# Patient Record
Sex: Male | Born: 1980 | Race: White | Hispanic: No | Marital: Single | Smoking: Current every day smoker
Health system: Southern US, Community
[De-identification: ages and names within clinical notes are randomized; demographics above are authoritative.]

## PROBLEM LIST (undated history)

## (undated) DIAGNOSIS — K219 Gastro-esophageal reflux disease without esophagitis: Secondary | ICD-10-CM

## (undated) HISTORY — PX: HERNIA REPAIR: SHX51

## (undated) HISTORY — PX: NO PAST SURGERIES: SHX2092

---
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
## 2001-05-21 ENCOUNTER — Emergency Department (HOSPITAL_COMMUNITY): Admission: EM | Admit: 2001-05-21 | Discharge: 2001-05-21 | Payer: Self-pay | Admitting: Emergency Medicine

## 2003-10-24 ENCOUNTER — Emergency Department (HOSPITAL_COMMUNITY): Admission: EM | Admit: 2003-10-24 | Discharge: 2003-10-24 | Payer: Self-pay | Admitting: Emergency Medicine

## 2005-03-18 ENCOUNTER — Emergency Department (HOSPITAL_COMMUNITY): Admission: EM | Admit: 2005-03-18 | Discharge: 2005-03-18 | Payer: Self-pay | Admitting: Emergency Medicine

## 2010-04-25 ENCOUNTER — Emergency Department: Payer: Self-pay | Admitting: Emergency Medicine

## 2019-05-18 ENCOUNTER — Emergency Department: Payer: Self-pay

## 2019-05-18 ENCOUNTER — Other Ambulatory Visit: Payer: Self-pay

## 2019-05-18 ENCOUNTER — Emergency Department
Admission: EM | Admit: 2019-05-18 | Discharge: 2019-05-18 | Disposition: A | Discharge status: Home / Self Care | Payer: Self-pay | Attending: Emergency Medicine | Admitting: Emergency Medicine

## 2019-05-18 DIAGNOSIS — F17200 Nicotine dependence, unspecified, uncomplicated: Secondary | ICD-10-CM | POA: Insufficient documentation

## 2019-05-18 DIAGNOSIS — K439 Ventral hernia without obstruction or gangrene: Secondary | ICD-10-CM | POA: Insufficient documentation

## 2019-05-18 DIAGNOSIS — R1013 Epigastric pain: Secondary | ICD-10-CM | POA: Insufficient documentation

## 2019-05-18 LAB — URINALYSIS, COMPLETE (UACMP) WITH MICROSCOPIC
Bacteria, UA: NONE SEEN
Bilirubin Urine: NEGATIVE
Glucose, UA: NEGATIVE mg/dL
Hgb urine dipstick: NEGATIVE
Ketones, ur: NEGATIVE mg/dL
Leukocytes,Ua: NEGATIVE
Nitrite: NEGATIVE
Protein, ur: NEGATIVE mg/dL
Specific Gravity, Urine: 1.017 (ref 1.005–1.030)
pH: 6 (ref 5.0–8.0)

## 2019-05-18 LAB — LIPASE, BLOOD: Lipase: 25 U/L (ref 11–51)

## 2019-05-18 LAB — COMPREHENSIVE METABOLIC PANEL
ALT: 33 U/L (ref 0–44)
AST: 22 U/L (ref 15–41)
Albumin: 3.9 g/dL (ref 3.5–5.0)
Alkaline Phosphatase: 74 U/L (ref 38–126)
Anion gap: 10 (ref 5–15)
BUN: 9 mg/dL (ref 6–20)
CO2: 24 mmol/L (ref 22–32)
Calcium: 9.3 mg/dL (ref 8.9–10.3)
Chloride: 104 mmol/L (ref 98–111)
Creatinine, Ser: 0.82 mg/dL (ref 0.61–1.24)
GFR calc Af Amer: >60 mL/min (ref >60)
GFR calc non Af Amer: >60 mL/min (ref >60)
Glucose, Bld: 119 mg/dL — ABNORMAL HIGH (ref 70–99)
Potassium: 3.8 mmol/L (ref 3.5–5.1)
Sodium: 138 mmol/L (ref 135–145)
Total Bilirubin: 0.6 mg/dL (ref 0.3–1.2)
Total Protein: 7 g/dL (ref 6.5–8.1)

## 2019-05-18 LAB — CBC
HCT: 43.9 % (ref 39.0–52.0)
Hemoglobin: 14.8 g/dL (ref 13.0–17.0)
MCH: 31.2 pg (ref 26.0–34.0)
MCHC: 33.7 g/dL (ref 30.0–36.0)
MCV: 92.6 fL (ref 80.0–100.0)
Platelets: 250 10*3/uL (ref 150–400)
RBC: 4.74 MIL/uL (ref 4.22–5.81)
RDW: 12.7 % (ref 11.5–15.5)
WBC: 5.7 10*3/uL (ref 4.0–10.5)
nRBC: 0 % (ref 0.0–0.2)

## 2019-05-18 LAB — LACTIC ACID, PLASMA: Lactic Acid, Venous: 1 mmol/L (ref 0.5–1.9)

## 2019-05-18 MED ORDER — FENTANYL CITRATE (PF) 100 MCG/2ML IJ SOLN
100.0000 ug | Freq: Once | INTRAMUSCULAR | Status: AC
  Administered 2019-05-18: 17:00:00 100 ug via INTRAVENOUS
  Filled 2019-05-18: qty 2

## 2019-05-18 MED ORDER — SODIUM CHLORIDE 0.9 % IV BOLUS
1000.0000 mL | Freq: Once | INTRAVENOUS | Status: AC
  Administered 2019-05-18: 1000 mL via INTRAVENOUS

## 2019-05-18 MED ORDER — IOHEXOL 300 MG/ML  SOLN
100.0000 mL | Freq: Once | INTRAMUSCULAR | Status: AC | PRN
  Administered 2019-05-18: 100 mL via INTRAVENOUS
  Filled 2019-05-18: qty 100

## 2019-05-18 NOTE — ED Provider Notes (Signed)
[facility] Emergency Department Provider Note ____________________________________________   None    (approximate)  I have reviewed the triage vital signs and the nursing notes.   HISTORY  Chief Complaint Hernia  HPI Johnny Crawford is a 39 y.o. male who presents to the emergency department for treatment and evaluation of abdominal pain due to hernia that he is typically able to reduce himself. He states that he noticed that it was out last night and has not been able to reduce it. Hernia is secondary to working out with pull up bar 2 years ago. He denies nausea or vomiting. He has been able to pass gas and have a bowel movement today. He denies medical or surgical history outside of abdominal hernia.     History reviewed. No pertinent past medical history.  There are no problems to display for this patient.   History reviewed. No pertinent surgical history.  Prior to Admission medications   Not on File    Allergies Patient has no known allergies.  History reviewed. No pertinent family history.  Social History Social History   Tobacco Use  . Smoking status: Current Every Day Smoker  Substance Use Topics  . Alcohol use: Not on file  . Drug use: Not on file    Review of Systems  Constitutional: No fever/chills Eyes: No visual changes. ENT: No sore throat. Cardiovascular: Denies chest pain. Respiratory: Denies shortness of breath. Gastrointestinal: Positive for abdominal pain. No nausea, no vomiting.  No diarrhea.  No constipation. Genitourinary: Negative for dysuria. Musculoskeletal: Negative for back pain. Skin: Negative for rash. Neurological: Negative for headaches, focal weakness or numbness. ____________________________________________   PHYSICAL EXAM:  VITAL SIGNS: ED Triage Vitals [05/18/19 1456]  Enc Vitals Group     BP 126/66     Pulse Rate 86     Resp 16     Temp 97.8 F (36.6 C)     Temp Source Oral     SpO2 98  %     Weight 185 lb (83.9 kg)     Height 5\' 10"  (1.778 m)     Head Circumference      Peak Flow      Pain Score 8     Pain Loc      Pain Edu?      Excl. in GC?     Constitutional: Alert and oriented. Well appearing and in no acute distress. Eyes: Conjunctivae are normal. PERRL. EOMI. Head: Atraumatic. Nose: No congestion/rhinnorhea. Mouth/Throat: Mucous membranes are moist.  Oropharynx non-erythematous. Neck: No stridor.   Hematological/Lymphatic/Immunilogical: No cervical lymphadenopathy. Cardiovascular: Normal rate, regular rhythm. Grossly normal heart sounds.  Good peripheral circulation. Respiratory: Normal respiratory effort.  No retractions. Lungs CTAB. Gastrointestinal: Soft and tender. Palpable epigastric ventral hernia. No abdominal bruits. Diminished bowel sounds x 4 quadrants. Genitourinary:  Musculoskeletal: No lower extremity tenderness nor edema.  No joint effusions. Neurologic:  Normal speech and language. No gross focal neurologic deficits are appreciated. No gait instability. Skin:  Skin is warm, dry and intact. No rash noted. No discoloration over the palpable epigastric hernia. Psychiatric: Mood and affect are normal. Speech and behavior are normal.  ____________________________________________   LABS (all labs ordered are listed, but only abnormal results are displayed)  Labs Reviewed  COMPREHENSIVE METABOLIC PANEL - Abnormal; Notable for the following components:      Result Value   Glucose, Bld 119 (*)    All other components within normal limits  URINALYSIS, COMPLETE (UACMP) WITH  MICROSCOPIC - Abnormal; Notable for the following components:   Color, Urine YELLOW (*)    APPearance CLEAR (*)    All other components within normal limits  LIPASE, BLOOD  CBC  LACTIC ACID, PLASMA   ____________________________________________  EKG  Not indicated. ____________________________________________  RADIOLOGY  ED MD interpretation:    Small epigastric  ventral hernia containing only fat without associated bowel incarceration or obstruction.  Official radiology report(s): CT ABDOMEN PELVIS W CONTRAST  Result Date: 05/18/2019 CLINICAL DATA:  39 year old male with history of abdominal wall hernia presenting with pain at the site of hernia. EXAM: CT ABDOMEN AND PELVIS WITH CONTRAST TECHNIQUE: Multidetector CT imaging of the abdomen and pelvis was performed using the standard protocol following bolus administration of intravenous contrast. CONTRAST:  OMNIPAQUE IOHEXOL 300 MG/ML  SOLN COMPARISON:  No priors. FINDINGS: Lower chest: Moderate-sized hiatal hernia. Hepatobiliary: Diffuse low attenuation throughout the hepatic parenchyma, indicative of hepatic steatosis. No suspicious cystic or solid hepatic lesions. No intra or extrahepatic biliary ductal dilatation. Gallbladder is normal in appearance. Pancreas: No pancreatic mass. No pancreatic ductal dilatation. No pancreatic or peripancreatic fluid collections or inflammatory changes. Spleen: Unremarkable. Adrenals/Urinary Tract: Bilateral kidneys and bilateral adrenal glands are normal in appearance. No hydroureteronephrosis. Urinary bladder is normal in appearance. Stomach/Bowel: Normal appearance of the stomach. No pathologic dilatation of small bowel or colon. Normal appendix. Vascular/Lymphatic: No significant atherosclerotic disease, aneurysm or dissection noted in the abdominal or pelvic vasculature. No lymphadenopathy noted in the abdomen or pelvis. Reproductive: Prostate gland and seminal vesicles are unremarkable in appearance. Other: Small epigastric ventral hernia containing only omental fat. No significant volume of ascites. No pneumoperitoneum. Musculoskeletal: There are no aggressive appearing lytic or blastic lesions noted in the visualized portions of the skeleton. IMPRESSION: 1. Small epigastric ventral hernia contains only omental fat. No associated bowel incarceration or obstruction at this  time. 2. Hepatic steatosis. 3. Moderate-sized hiatal hernia. Electronically Signed   By: Trudie Reed M.D.   On: 05/18/2019 17:56    ____________________________________________   PROCEDURES  Procedure(s) performed (including Critical Care):  Procedures  ____________________________________________   INITIAL IMPRESSION / ASSESSMENT AND PLAN     39 year old male presenting to the emergency department for treatment and evaluation of abdominal hernia that he is typically able to reduce on his own.  He states that he has had pain in has been unable to get the hernia to reduce since last night.  DIFFERENTIAL DIAGNOSIS  Nonreducible ventral hernia, incarcerated hernia  ED COURSE  Initial attempt to reduce the hernia was unsuccessful.  Patient is excessively tender is unable to relax his abdomen.  Plan will be to give him IV fentanyl for pain and relaxation and then attempt again.   After fentanyl given patient was laid flat and hernia reduction attempted.  This time it appears that it was successful.  The patient states that he feels better, and does not have that deep burning pain but he is not sure that it is completely back to normal.  CT ordered.  CT scan is reassuring.  Does show that he has a epigastric ventral hernia that currently only contains fat.  Plan will be to discharge him home.  He was advised to call and schedule an appointment with surgery when possible.  He was given ER return precautions. ____________________________________________   FINAL CLINICAL IMPRESSION(S) / ED DIAGNOSES  Final diagnoses:  Ventral hernia without obstruction or gangrene     ED Discharge Orders    None  KELYN KOSKELA was evaluated in Emergency Department on 05/18/2019 for the symptoms described in the history of present illness. He was evaluated in the context of the global COVID-19 pandemic, which necessitated consideration that the patient might be at risk for infection  with the SARS-CoV-2 virus that causes COVID-19. Institutional protocols and algorithms that pertain to the evaluation of patients at risk for COVID-19 are in a state of rapid change based on information released by regulatory bodies including the CDC and federal and state organizations. These policies and algorithms were followed during the patient's care in the ED.   Note:  This document was prepared using Dragon voice recognition software and may include unintentional dictation errors.   Victorino Dike, FNP 05/18/19 1955    Harvest Dark, MD 05/18/19 2314

## 2019-05-18 NOTE — ED Triage Notes (Signed)
Pt states about 6 months ago he was dx with hernia and states last night began to hurt a lot. Hernia noted to upper abd. A&O, ambulatory. denie N&V&D.

## 2019-05-18 NOTE — Discharge Instructions (Addendum)
I recommend that you call and schedule an appointment with surgery to discuss hernia repair.  If your hernia comes out again and won't go back in as it did last night, return to the ER.  Return to the ER if there is any discoloration or unusual pain over the site of the hernia.  Avoid heavy lifting and using your stomach muscles to sit up.

## 2019-05-18 NOTE — ED Notes (Signed)
Pt with steady gait in room, alert and oriented. Pt refused wheel chair to lobby multiple times. Pt states family is picking him up and family will drive his car home. Pt denies dizziness or sleepiness. Pt walking with steady gait.

## 2019-05-22 ENCOUNTER — Ambulatory Visit: Payer: Self-pay | Admitting: General Surgery

## 2019-05-29 ENCOUNTER — Ambulatory Visit (INDEPENDENT_AMBULATORY_CARE_PROVIDER_SITE_OTHER): Payer: Self-pay | Admitting: General Surgery

## 2019-05-29 ENCOUNTER — Encounter: Payer: Self-pay | Admitting: General Surgery

## 2019-05-29 ENCOUNTER — Other Ambulatory Visit: Payer: Self-pay

## 2019-05-29 VITALS — BP 119/73 | HR 83 | Temp 95.5°F | Ht 70.0 in | Wt 187.0 lb

## 2019-05-29 DIAGNOSIS — K436 Other and unspecified ventral hernia with obstruction, without gangrene: Secondary | ICD-10-CM | POA: Insufficient documentation

## 2019-05-29 DIAGNOSIS — K439 Ventral hernia without obstruction or gangrene: Secondary | ICD-10-CM

## 2019-05-29 NOTE — H&P (View-Only) (Signed)
Patient ID: Johnny Crawford, male   DOB: 1980-08-23, 39 y.o.   MRN: [id number]  Chief Complaint  Patient presents with  . New Patient (Initial Visit)     New Patient follow up ED @ [facility] for ventral hernia w/Dr. Lady Gary (03/21)    HPI Johnny Crawford is a 39 y.o. male.  He presented to the emergency department on 18 May 2019.  He had epigastric abdominal pain.  A CT scan was performed that demonstrated a ventral hernia in the epigastric region.  He states that it is uncomfortable, but not specifically painful.  He says that it interferes with his ability to exercise.  He only notices bulging with Valsalva-type maneuvers.  He denies any nausea or vomiting.  No fevers or chills.  No diarrhea or constipation.  Reports that the hernia was first identified a few years ago while he was incarcerated, but as he was getting ready to be released, they did not repair it; he was told to seek medical attention if he experienced any issues.  This is what ultimately caused him to seek emergency room attention on March 21.  He is interested in surgical repair.   History reviewed. No pertinent past medical history.  History reviewed. No pertinent surgical history.  Family History  Problem Relation Age of Onset  . COPD Mother   . Stroke Sister     Social History Social History   Tobacco Use  . Smoking status: Current Every Day Smoker  . Smokeless tobacco: Never Used  Substance Use Topics  . Alcohol use: Never  . Drug use: Never    No Known Allergies  No current outpatient medications on file.   No current facility-administered medications for this visit.    Review of Systems Review of Systems  All other systems reviewed and are negative. Or as discussed in the history of present illness.  Blood pressure 119/73, pulse 83, temperature (!) 95.5 F (35.3 C), temperature source Temporal, height 5\' 10"  (1.778 m), weight 187 lb (84.8 kg), SpO2 97 %. Body mass index is 26.83 kg/m.  Physical  Exam Physical Exam Vitals reviewed.  Constitutional:      General: He is not in acute distress.    Appearance: Normal appearance. He is normal weight.  HENT:     Head: Normocephalic and atraumatic.     Nose:     Comments: Covered with a mask secondary to COVID-19 precautions    Mouth/Throat:     Comments: Covered with a mask secondary to COVID-19 precautions Eyes:     General:        Right eye: No discharge.        Left eye: No discharge.     Conjunctiva/sclera: Conjunctivae normal.  Neck:     Comments: No palpable thyroid masses or thyromegaly appreciated.  The gland moves freely with deglutition. Cardiovascular:     Rate and Rhythm: Normal rate and regular rhythm.     Pulses: Normal pulses.  Pulmonary:     Effort: Pulmonary effort is normal.     Breath sounds: Normal breath sounds.  Abdominal:     General: Abdomen is flat. Bowel sounds are normal.     Palpations: Abdomen is soft.     Hernia: A hernia is present.     Comments: There is an epigastric hernia present just below the xiphoid process, extending to approximately 8 cm above the umbilicus.  Genitourinary:    Comments: Deferred Musculoskeletal:  General: No swelling or tenderness. Normal range of motion.     Cervical back: Normal range of motion. No rigidity.  Lymphadenopathy:     Cervical: No cervical adenopathy.  Skin:    General: Skin is warm.     Comments: Extensive tattoos  Neurological:     General: No focal deficit present.     Mental Status: He is alert and oriented to person, place, and time.  Psychiatric:        Mood and Affect: Mood normal.        Behavior: Behavior normal.        Thought Content: Thought content normal.     Data Reviewed I reviewed the emergency department report from May 18, 2019.  The imaging was reviewed and does demonstrate an epigastric hernia.  There also appears to be a tiny umbilical hernia, but I am unable to appreciate this on physical examination.  The  radiologist report is copied here: CLINICAL DATA:  39 year old male with history of abdominal wall hernia presenting with pain at the site of hernia.  EXAM: CT ABDOMEN AND PELVIS WITH CONTRAST  TECHNIQUE: Multidetector CT imaging of the abdomen and pelvis was performed using the standard protocol following bolus administration of intravenous contrast.  CONTRAST:  139mL OMNIPAQUE IOHEXOL 300 MG/ML  SOLN  COMPARISON:  No priors.  FINDINGS: Lower chest: Moderate-sized hiatal hernia.  Hepatobiliary: Diffuse low attenuation throughout the hepatic parenchyma, indicative of hepatic steatosis. No suspicious cystic or solid hepatic lesions. No intra or extrahepatic biliary ductal dilatation. Gallbladder is normal in appearance.  Pancreas: No pancreatic mass. No pancreatic ductal dilatation. No pancreatic or peripancreatic fluid collections or inflammatory changes.  Spleen: Unremarkable.  Adrenals/Urinary Tract: Bilateral kidneys and bilateral adrenal glands are normal in appearance. No hydroureteronephrosis. Urinary bladder is normal in appearance.  Stomach/Bowel: Normal appearance of the stomach. No pathologic dilatation of small bowel or colon. Normal appendix.  Vascular/Lymphatic: No significant atherosclerotic disease, aneurysm or dissection noted in the abdominal or pelvic vasculature. No lymphadenopathy noted in the abdomen or pelvis.  Reproductive: Prostate gland and seminal vesicles are unremarkable in appearance.  Other: Small epigastric ventral hernia containing only omental fat. No significant volume of ascites. No pneumoperitoneum.  Musculoskeletal: There are no aggressive appearing lytic or blastic lesions noted in the visualized portions of the skeleton.  IMPRESSION: 1. Small epigastric ventral hernia contains only omental fat. No associated bowel incarceration or obstruction at this time. 2. Hepatic steatosis. 3. Moderate-sized hiatal  hernia.    Assessment This is a 39 year old man with an epigastric hernia.  It is symptomatic and prevents him from engaging in his desired daily activities.  Plan I have offered him an open repair of his epigastric hernia.  I discussed the risks of the operation with him.  These include, but are not limited to, bleeding, infection, mesh complications, injury to bowel or other surrounding structures, and hernia recurrence.  He is a smoker and I have cautioned him that this puts him at higher risk for recurrence.  I recommended that he quit smoking prior to his OR date.  He is also aware that he will have to refrain from lifting anything heavier than 10 pounds for 6 weeks after his operation.  He has agreed to accept these parameters and we will work on getting him scheduled.    Johnny Crawford 05/29/2019, 10:41 AM

## 2019-05-29 NOTE — Progress Notes (Signed)
Patient ID: Johnny Crawford, male   DOB: 1980-08-23, 39 y.o.   MRN: [id number]  Chief Complaint  Patient presents with  . New Patient (Initial Visit)     New Patient follow up ED @ [facility] for ventral hernia w/Dr. Lady Gary (03/21)    HPI Johnny Crawford is a 39 y.o. male.  He presented to the emergency department on 18 May 2019.  He had epigastric abdominal pain.  A CT scan was performed that demonstrated a ventral hernia in the epigastric region.  He states that it is uncomfortable, but not specifically painful.  He says that it interferes with his ability to exercise.  He only notices bulging with Valsalva-type maneuvers.  He denies any nausea or vomiting.  No fevers or chills.  No diarrhea or constipation.  Reports that the hernia was first identified a few years ago while he was incarcerated, but as he was getting ready to be released, they did not repair it; he was told to seek medical attention if he experienced any issues.  This is what ultimately caused him to seek emergency room attention on March 21.  He is interested in surgical repair.   History reviewed. No pertinent past medical history.  History reviewed. No pertinent surgical history.  Family History  Problem Relation Age of Onset  . COPD Mother   . Stroke Sister     Social History Social History   Tobacco Use  . Smoking status: Current Every Day Smoker  . Smokeless tobacco: Never Used  Substance Use Topics  . Alcohol use: Never  . Drug use: Never    No Known Allergies  No current outpatient medications on file.   No current facility-administered medications for this visit.    Review of Systems Review of Systems  All other systems reviewed and are negative. Or as discussed in the history of present illness.  Blood pressure 119/73, pulse 83, temperature (!) 95.5 F (35.3 C), temperature source Temporal, height 5\' 10"  (1.778 m), weight 187 lb (84.8 kg), SpO2 97 %. Body mass index is 26.83 kg/m.  Physical  Exam Physical Exam Vitals reviewed.  Constitutional:      General: He is not in acute distress.    Appearance: Normal appearance. He is normal weight.  HENT:     Head: Normocephalic and atraumatic.     Nose:     Comments: Covered with a mask secondary to COVID-19 precautions    Mouth/Throat:     Comments: Covered with a mask secondary to COVID-19 precautions Eyes:     General:        Right eye: No discharge.        Left eye: No discharge.     Conjunctiva/sclera: Conjunctivae normal.  Neck:     Comments: No palpable thyroid masses or thyromegaly appreciated.  The gland moves freely with deglutition. Cardiovascular:     Rate and Rhythm: Normal rate and regular rhythm.     Pulses: Normal pulses.  Pulmonary:     Effort: Pulmonary effort is normal.     Breath sounds: Normal breath sounds.  Abdominal:     General: Abdomen is flat. Bowel sounds are normal.     Palpations: Abdomen is soft.     Hernia: A hernia is present.     Comments: There is an epigastric hernia present just below the xiphoid process, extending to approximately 8 cm above the umbilicus.  Genitourinary:    Comments: Deferred Musculoskeletal:  General: No swelling or tenderness. Normal range of motion.     Cervical back: Normal range of motion. No rigidity.  Lymphadenopathy:     Cervical: No cervical adenopathy.  Skin:    General: Skin is warm.     Comments: Extensive tattoos  Neurological:     General: No focal deficit present.     Mental Status: He is alert and oriented to person, place, and time.  Psychiatric:        Mood and Affect: Mood normal.        Behavior: Behavior normal.        Thought Content: Thought content normal.     Data Reviewed I reviewed the emergency department report from May 18, 2019.  The imaging was reviewed and does demonstrate an epigastric hernia.  There also appears to be a tiny umbilical hernia, but I am unable to appreciate this on physical examination.  The  radiologist report is copied here: CLINICAL DATA:  38-year-old male with history of abdominal wall hernia presenting with pain at the site of hernia.  EXAM: CT ABDOMEN AND PELVIS WITH CONTRAST  TECHNIQUE: Multidetector CT imaging of the abdomen and pelvis was performed using the standard protocol following bolus administration of intravenous contrast.  CONTRAST:  100mL OMNIPAQUE IOHEXOL 300 MG/ML  SOLN  COMPARISON:  No priors.  FINDINGS: Lower chest: Moderate-sized hiatal hernia.  Hepatobiliary: Diffuse low attenuation throughout the hepatic parenchyma, indicative of hepatic steatosis. No suspicious cystic or solid hepatic lesions. No intra or extrahepatic biliary ductal dilatation. Gallbladder is normal in appearance.  Pancreas: No pancreatic mass. No pancreatic ductal dilatation. No pancreatic or peripancreatic fluid collections or inflammatory changes.  Spleen: Unremarkable.  Adrenals/Urinary Tract: Bilateral kidneys and bilateral adrenal glands are normal in appearance. No hydroureteronephrosis. Urinary bladder is normal in appearance.  Stomach/Bowel: Normal appearance of the stomach. No pathologic dilatation of small bowel or colon. Normal appendix.  Vascular/Lymphatic: No significant atherosclerotic disease, aneurysm or dissection noted in the abdominal or pelvic vasculature. No lymphadenopathy noted in the abdomen or pelvis.  Reproductive: Prostate gland and seminal vesicles are unremarkable in appearance.  Other: Small epigastric ventral hernia containing only omental fat. No significant volume of ascites. No pneumoperitoneum.  Musculoskeletal: There are no aggressive appearing lytic or blastic lesions noted in the visualized portions of the skeleton.  IMPRESSION: 1. Small epigastric ventral hernia contains only omental fat. No associated bowel incarceration or obstruction at this time. 2. Hepatic steatosis. 3. Moderate-sized hiatal  hernia.    Assessment This is a 38-year-old man with an epigastric hernia.  It is symptomatic and prevents him from engaging in his desired daily activities.  Plan I have offered him an open repair of his epigastric hernia.  I discussed the risks of the operation with him.  These include, but are not limited to, bleeding, infection, mesh complications, injury to bowel or other surrounding structures, and hernia recurrence.  He is a smoker and I have cautioned him that this puts him at higher risk for recurrence.  I recommended that he quit smoking prior to his OR date.  He is also aware that he will have to refrain from lifting anything heavier than 10 pounds for 6 weeks after his operation.  He has agreed to accept these parameters and we will work on getting him scheduled.    Jolly Bleicher 05/29/2019, 10:41 AM   

## 2019-05-29 NOTE — Patient Instructions (Addendum)
Our surgery scheduler Kennyth Arnold will contact you within the next 24-48 hrs for surgery. During that call, Kennyth Arnold will discuss the preparation for surgery. Kennyth Arnold will also discuss the different dates and times for surgery. Please have the BLUE sheet available when she contacts you. If you have any questions or concerns, please do not hesitate to contact our office.    Ventral Hernia  A ventral hernia is a bulge of tissue from inside the abdomen that pushes through a weak area of the muscles that form the front wall of the abdomen. The tissues inside the abdomen are inside a sac (peritoneum). These tissues include the small intestine, large intestine, and the fatty tissue that covers the intestines (omentum). Sometimes, the bulge that forms a hernia contains intestines. Other hernias contain only fat. Ventral hernias do not go away without surgical treatment. There are several types of ventral hernias. You may have:  A hernia at an incision site from previous abdominal surgery (incisional hernia).  A hernia just above the belly button (epigastric hernia), or at the belly button (umbilical hernia). These types of hernias can develop from heavy lifting or straining.  A hernia that comes and goes (reducible hernia). It may be visible only when you lift or strain. This type of hernia can be pushed back into the abdomen (reduced).  A hernia that traps abdominal tissue inside the hernia (incarcerated hernia). This type of hernia does not reduce.  A hernia that cuts off blood flow to the tissues inside the hernia (strangulated hernia). The tissues can start to die if this happens. This is a very painful bulge that cannot be reduced. A strangulated hernia is a medical emergency. What are the causes? This condition is caused by abdominal tissue putting pressure on an area of weakness in the abdominal muscles. What increases the risk? The following factors may make you more likely to develop this  condition:  Being male.  Being 63 or older.  Being overweight or obese.  Having had previous abdominal surgery, especially if there was an infection after surgery.  Having had an injury to the abdominal wall.  Having had several pregnancies.  Having a buildup of fluid inside the abdomen (ascites). What are the signs or symptoms? The only symptom of a ventral hernia may be a painless bulge in the abdomen. A reducible hernia may be visible only when you strain, cough, or lift. Other symptoms may include:  Dull pain.  A feeling of pressure. Signs and symptoms of a strangulated hernia may include:  Increasing pain.  Nausea and vomiting.  Pain when pressing on the hernia.  The skin over the hernia turning red or purple.  Constipation.  Blood in the stool (feces). How is this diagnosed? This condition may be diagnosed based on:  Your symptoms.  Your medical history.  A physical exam. You may be asked to cough or strain while standing. These actions increase the pressure inside your abdomen and force the hernia through the opening in your muscles. Your health care provider may try to reduce the hernia by pressing on it.  Imaging studies, such as an ultrasound or CT scan. How is this treated? This condition is treated with surgery. If you have a strangulated hernia, surgery is done as soon as possible. If your hernia is small and not incarcerated, you may be asked to lose some weight before surgery. Follow these instructions at home:  Follow instructions from your health care provider about eating or drinking restrictions.  If  you are overweight, your health care provider may recommend that you increase your activity level and eat a healthier diet.  Do not lift anything that is heavier than 10 lb (4.5 kg).  Return to your normal activities as told by your health care provider. Ask your health care provider what activities are safe for you. You may need to avoid  activities that increase pressure on your hernia.  Take over-the-counter and prescription medicines only as told by your health care provider.  Keep all follow-up visits as told by your health care provider. This is important. Contact a health care provider if:  Your hernia gets larger.  Your hernia becomes painful. Get help right away if:  Your hernia becomes increasingly painful.  You have pain along with any of the following: ? Changes in skin color in the area of the hernia. ? Nausea. ? Vomiting. ? Fever. Summary  A ventral hernia is a bulge of tissue from inside the abdomen that pushes through a weak area of the muscles that form the front wall of the abdomen.  This condition is treated with surgery, which may be urgent depending on your hernia.  Do not lift anything that is heavier than 10 lb (4.5 kg), and follow activity instructions from your health care provider. This information is not intended to replace advice given to you by your health care provider. Make sure you discuss any questions you have with your health care provider. Document Revised: 03/28/2017 Document Reviewed: 09/04/2016 Elsevier Patient Education  [place].

## 2019-06-02 ENCOUNTER — Telehealth: Payer: Self-pay | Admitting: General Surgery

## 2019-06-02 NOTE — Telephone Encounter (Signed)
Pt has been advised of Pre-Admission date/time, COVID Testing date and Surgery date.  Surgery Date: 06/16/19 Preadmission Testing Date: 06/06/19 (phone 8a-1p) Covid Testing Date: 06/12/19 - patient advised to [facility] to [facility] ([address] [facility]) between 8a-1p   Patient has been made aware to call [phone], between 1-3:00pm the day before surgery, to find out what time to arrive for surgery.

## 2019-06-06 ENCOUNTER — Other Ambulatory Visit: Payer: Self-pay | Admitting: General Surgery

## 2019-06-06 ENCOUNTER — Other Ambulatory Visit: Payer: Self-pay

## 2019-06-06 ENCOUNTER — Encounter
Admission: RE | Admit: 2019-06-06 | Discharge: 2019-06-06 | Disposition: A | Discharge status: Home / Self Care | Payer: Self-pay | Source: Ambulatory Visit | Attending: General Surgery | Admitting: General Surgery

## 2019-06-06 DIAGNOSIS — Z01818 Encounter for other preprocedural examination: Secondary | ICD-10-CM | POA: Insufficient documentation

## 2019-06-06 DIAGNOSIS — K439 Ventral hernia without obstruction or gangrene: Secondary | ICD-10-CM

## 2019-06-06 HISTORY — DX: Gastro-esophageal reflux disease without esophagitis: K21.9

## 2019-06-06 NOTE — Patient Instructions (Addendum)
INSTRUCTIONS FOR SURGERY     Your surgery is scheduled for:   Monday, April 19TH     To find out your arrival time for the day of surgery,          please call [phone] between 1 pm and 3 pm on :  Friday, April 16TH     When you arrive for surgery, report to the SECOND FLOOR OF THE MEDICAL MALL.       Do NOT stop on the first floor to register.    REMEMBER: Instructions that are not followed completely may result in serious medical risk,  up to and including death, or upon the discretion of your surgeon and anesthesiologist,            your surgery may need to be rescheduled.  __X__ 1. Do not eat food after midnight the night before your procedure.                    No gum, candy, lozenger, tic tacs, tums or hard candies.                  ABSOLUTELY NOTHING SOLID IN YOUR MOUTH AFTER MIDNIGHT                    You may drink unlimited clear liquids up to 2 hours before you are scheduled to arrive for surgery.                   Do not drink anything within those 2 hours unless you need to take medicine, then take the                   smallest amount you need.  Clear liquids include:  water, apple juice without pulp,                   any flavor Gatorade, Black coffee, black tea.  Sugar may be added but no dairy/ honey /lemon.                        Broth and jello is not considered a clear liquid.  __x__  2. On the morning of surgery, please brush your teeth with toothpaste and water. You may rinse with                  mouthwash if you wish but DO NOT SWALLOW TOOTHPASTE OR MOUTHWASH  __X___3. NO alcohol for 24 hours before or after surgery.  __x___ 4.  Do NOT smoke or use e-cigarettes for 24 HOURS PRIOR TO SURGERY.                      DO NOT Use any chewable tobacco products for at least 6 hours prior to surgery.  ___x__ 6. Notify your doctor if there is any change in your medical condition, such as fever,    infection, vomitting, diarrhea or any open sores.  __x___ 7.  USE the CHG SOAP as instructed, the night before surgery and the day of surgery.  Once you have washed with this soap, do NOT use any of the following: Powders, perfumes                    or lotions. Please do not wear make up, hairpins, clips or nail polish. You may wear deodorant.                   Men may shave their face and neck.  Women need to shave 48 hours prior to surgery.                   DO NOT wear ANY jewelry on the day of surgery. If there are rings that are too tight to                    remove easily, please address this prior to the surgery day. Piercings need to be removed.                                                                     NO METAL ON YOUR BODY.                    Do NOT bring any valuables.  If you came to Pre-Admit testing then you will not need license,                     insurance card or credit card.  If you will be staying overnight, please either leave your things in                     the car or have your family be responsible for these items.                     [facility] IS NOT RESPONSIBLE FOR BELONGINGS OR VALUABLES.  ___X__ 8. DO NOT wear contact lenses on surgery day.  You may not have dentures,                     Hearing aides, contacts or glasses in the operating room. These items can be                    Placed in the Recovery Room to receive immediately after surgery.  __x___ 9. IF YOU ARE SCHEDULED TO GO HOME ON THE SAME DAY, YOU MUST                   Have someone to drive you home and to stay with you  for the first 24 hours.                    Have an arrangement prior to arriving on surgery day.  ___x__ 10. Take the following medications on the morning of surgery with a sip of water:                              1.  nothing                     2.  ____ 11.  Follow any instructions provided to you by your surgeon.                         Such as enema, clear liquid bowel prep  __X__  12. STOP all Aspirin products one week prior to surgery.                       THIS INCLUDES BC POWDERS / GOODIES POWDER  __x___ 13. STOP Anti-inflammatories one week prior to surgery                      This includes IBUPROFEN / MOTRIN / ADVIL / ALEVE/ NAPROXYN                    YOU MAY TAKE TYLENOL ANY TIME PRIOR TO SURGERY.  ___x___18.  Wear clean and comfortable clothing to the hospital.                      Have something loose around your middle.  BRING PHONE NUMBERS FOR YOUR CONTACT PEOPLE.  BEGIN STOOL SOFTENERS A DAY OR TWO PRIOR TO SURGERY.  HAVE A PILLOW TO SUPPORT YOUR ABDOMEN AFTER SURGERY.

## 2019-06-12 ENCOUNTER — Other Ambulatory Visit
Admission: RE | Admit: 2019-06-12 | Discharge: 2019-06-12 | Disposition: A | Discharge status: Home / Self Care | Payer: HRSA Program | Source: Ambulatory Visit | Attending: General Surgery | Admitting: General Surgery

## 2019-06-12 ENCOUNTER — Other Ambulatory Visit: Payer: Self-pay

## 2019-06-12 DIAGNOSIS — Z20822 Contact with and (suspected) exposure to covid-19: Secondary | ICD-10-CM | POA: Insufficient documentation

## 2019-06-12 DIAGNOSIS — Z01812 Encounter for preprocedural laboratory examination: Secondary | ICD-10-CM | POA: Diagnosis present

## 2019-06-12 LAB — SARS CORONAVIRUS 2 (TAT 6-24 HRS): SARS Coronavirus 2: NEGATIVE

## 2019-06-16 ENCOUNTER — Ambulatory Visit: Payer: Self-pay | Admitting: Registered Nurse

## 2019-06-16 ENCOUNTER — Other Ambulatory Visit: Payer: Self-pay

## 2019-06-16 ENCOUNTER — Ambulatory Visit
Admission: AD | Admit: 2019-06-16 | Discharge: 2019-06-16 | Disposition: A | Discharge status: Home / Self Care | Payer: Self-pay | Attending: General Surgery | Admitting: General Surgery

## 2019-06-16 ENCOUNTER — Encounter
Admission: AD | Disposition: A | Discharge status: Home / Self Care | Payer: Self-pay | Source: Home / Self Care | Attending: General Surgery

## 2019-06-16 ENCOUNTER — Encounter: Payer: Self-pay | Admitting: General Surgery

## 2019-06-16 DIAGNOSIS — K436 Other and unspecified ventral hernia with obstruction, without gangrene: Secondary | ICD-10-CM | POA: Insufficient documentation

## 2019-06-16 DIAGNOSIS — K760 Fatty (change of) liver, not elsewhere classified: Secondary | ICD-10-CM | POA: Insufficient documentation

## 2019-06-16 DIAGNOSIS — F17200 Nicotine dependence, unspecified, uncomplicated: Secondary | ICD-10-CM | POA: Insufficient documentation

## 2019-06-16 DIAGNOSIS — K439 Ventral hernia without obstruction or gangrene: Secondary | ICD-10-CM

## 2019-06-16 HISTORY — PX: EPIGASTRIC HERNIA REPAIR: SHX404

## 2019-06-16 SURGERY — REPAIR, HERNIA, EPIGASTRIC, ADULT
Anesthesia: General

## 2019-06-16 MED ORDER — CELECOXIB 200 MG PO CAPS
ORAL_CAPSULE | ORAL | Status: AC
  Administered 2019-06-16: 200 mg via ORAL
  Filled 2019-06-16: qty 1

## 2019-06-16 MED ORDER — MIDAZOLAM HCL 2 MG/2ML IJ SOLN
INTRAMUSCULAR | Status: AC
  Filled 2019-06-16: qty 2

## 2019-06-16 MED ORDER — ONDANSETRON HCL 4 MG/2ML IJ SOLN
INTRAMUSCULAR | Status: AC
  Filled 2019-06-16: qty 2

## 2019-06-16 MED ORDER — CHLORHEXIDINE GLUCONATE CLOTH 2 % EX PADS
6.0000 | MEDICATED_PAD | Freq: Once | CUTANEOUS | Status: AC
  Administered 2019-06-16: 6 via TOPICAL

## 2019-06-16 MED ORDER — ACETAMINOPHEN 500 MG PO TABS
ORAL_TABLET | ORAL | Status: AC
  Administered 2019-06-16: 1000 mg via ORAL
  Filled 2019-06-16: qty 2

## 2019-06-16 MED ORDER — ONDANSETRON HCL 4 MG/2ML IJ SOLN
INTRAMUSCULAR | Status: DC | PRN
  Administered 2019-06-16: 4 mg via INTRAVENOUS

## 2019-06-16 MED ORDER — LACTATED RINGERS IV SOLN: INTRAVENOUS | Status: DC

## 2019-06-16 MED ORDER — GABAPENTIN 300 MG PO CAPS
ORAL_CAPSULE | ORAL | Status: AC
  Administered 2019-06-16: 300 mg via ORAL
  Filled 2019-06-16: qty 1

## 2019-06-16 MED ORDER — GLYCOPYRROLATE 0.2 MG/ML IJ SOLN
INTRAMUSCULAR | Status: AC
  Filled 2019-06-16: qty 1

## 2019-06-16 MED ORDER — BUPIVACAINE HCL (PF) 0.25 % IJ SOLN
INTRAMUSCULAR | Status: AC
  Filled 2019-06-16: qty 30

## 2019-06-16 MED ORDER — BUPIVACAINE LIPOSOME 1.3 % IJ SUSP
INTRAMUSCULAR | Status: DC | PRN
  Administered 2019-06-16: 20 mL

## 2019-06-16 MED ORDER — GLYCOPYRROLATE 0.2 MG/ML IJ SOLN
INTRAMUSCULAR | Status: DC | PRN
  Administered 2019-06-16: .2 mg via INTRAVENOUS

## 2019-06-16 MED ORDER — ROCURONIUM BROMIDE 100 MG/10ML IV SOLN
INTRAVENOUS | Status: DC | PRN
  Administered 2019-06-16: 50 mg via INTRAVENOUS

## 2019-06-16 MED ORDER — DEXAMETHASONE SODIUM PHOSPHATE 10 MG/ML IJ SOLN
INTRAMUSCULAR | Status: AC
  Filled 2019-06-16: qty 1

## 2019-06-16 MED ORDER — FENTANYL CITRATE (PF) 100 MCG/2ML IJ SOLN
INTRAMUSCULAR | Status: AC
  Filled 2019-06-16: qty 2

## 2019-06-16 MED ORDER — LIDOCAINE-EPINEPHRINE 1 %-1:100000 IJ SOLN
INTRAMUSCULAR | Status: DC | PRN
  Administered 2019-06-16: 10 mL via INTRAMUSCULAR

## 2019-06-16 MED ORDER — PHENYLEPHRINE HCL (PRESSORS) 10 MG/ML IV SOLN
INTRAVENOUS | Status: DC | PRN
  Administered 2019-06-16: 100 ug via INTRAVENOUS

## 2019-06-16 MED ORDER — ACETAMINOPHEN 500 MG PO TABS: 1000.0000 mg | ORAL_TABLET | ORAL | Status: AC

## 2019-06-16 MED ORDER — ONDANSETRON HCL 4 MG/2ML IJ SOLN: 4.0000 mg | Freq: Once | INTRAMUSCULAR | Status: DC | PRN

## 2019-06-16 MED ORDER — FENTANYL CITRATE (PF) 100 MCG/2ML IJ SOLN
INTRAMUSCULAR | Status: DC | PRN
  Administered 2019-06-16: 50 ug via INTRAVENOUS
  Administered 2019-06-16: 100 ug via INTRAVENOUS

## 2019-06-16 MED ORDER — LIDOCAINE-EPINEPHRINE 1 %-1:100000 IJ SOLN
INTRAMUSCULAR | Status: AC
  Filled 2019-06-16: qty 1

## 2019-06-16 MED ORDER — BUPIVACAINE LIPOSOME 1.3 % IJ SUSP
INTRAMUSCULAR | Status: AC
  Filled 2019-06-16: qty 20

## 2019-06-16 MED ORDER — LIDOCAINE HCL (PF) 2 % IJ SOLN
INTRAMUSCULAR | Status: AC
  Filled 2019-06-16: qty 5

## 2019-06-16 MED ORDER — GABAPENTIN 300 MG PO CAPS: 300.0000 mg | ORAL_CAPSULE | ORAL | Status: AC

## 2019-06-16 MED ORDER — HYDROCODONE-ACETAMINOPHEN 5-325 MG PO TABS: 1.0000 | ORAL_TABLET | Freq: Four times a day (QID) | ORAL | 0 refills | Status: DC | PRN

## 2019-06-16 MED ORDER — LIDOCAINE HCL (CARDIAC) PF 100 MG/5ML IV SOSY
PREFILLED_SYRINGE | INTRAVENOUS | Status: DC | PRN
  Administered 2019-06-16: 100 mg via INTRAVENOUS

## 2019-06-16 MED ORDER — FAMOTIDINE 20 MG PO TABS: 20.0000 mg | ORAL_TABLET | Freq: Once | ORAL | Status: AC

## 2019-06-16 MED ORDER — PROPOFOL 10 MG/ML IV BOLUS
INTRAVENOUS | Status: DC | PRN
  Administered 2019-06-16: 170 mg via INTRAVENOUS

## 2019-06-16 MED ORDER — FAMOTIDINE 20 MG PO TABS
ORAL_TABLET | ORAL | Status: AC
  Administered 2019-06-16: 20 mg via ORAL
  Filled 2019-06-16: qty 1

## 2019-06-16 MED ORDER — CEFAZOLIN SODIUM-DEXTROSE 2-4 GM/100ML-% IV SOLN
2.0000 g | INTRAVENOUS | Status: AC
  Administered 2019-06-16: 10:00:00 2 g via INTRAVENOUS

## 2019-06-16 MED ORDER — FENTANYL CITRATE (PF) 100 MCG/2ML IJ SOLN
25.0000 ug | INTRAMUSCULAR | Status: DC | PRN
  Administered 2019-06-16: 25 ug via INTRAVENOUS

## 2019-06-16 MED ORDER — CELECOXIB 200 MG PO CAPS: 200.0000 mg | ORAL_CAPSULE | ORAL | Status: AC

## 2019-06-16 MED ORDER — BUPIVACAINE LIPOSOME 1.3 % IJ SUSP: 20.0000 mL | Freq: Once | INTRAMUSCULAR | Status: DC

## 2019-06-16 MED ORDER — PHENYLEPHRINE HCL (PRESSORS) 10 MG/ML IV SOLN
INTRAVENOUS | Status: AC
  Filled 2019-06-16: qty 1

## 2019-06-16 MED ORDER — SUGAMMADEX SODIUM 200 MG/2ML IV SOLN
INTRAVENOUS | Status: DC | PRN
  Administered 2019-06-16: 172.4 mg via INTRAVENOUS

## 2019-06-16 MED ORDER — PROPOFOL 10 MG/ML IV BOLUS
INTRAVENOUS | Status: AC
  Filled 2019-06-16: qty 20

## 2019-06-16 MED ORDER — IBUPROFEN 800 MG PO TABS: 800.0000 mg | ORAL_TABLET | Freq: Three times a day (TID) | ORAL | 0 refills | Status: DC | PRN

## 2019-06-16 MED ORDER — ROCURONIUM BROMIDE 10 MG/ML (PF) SYRINGE
PREFILLED_SYRINGE | INTRAVENOUS | Status: AC
  Filled 2019-06-16: qty 10

## 2019-06-16 MED ORDER — MIDAZOLAM HCL 2 MG/2ML IJ SOLN
INTRAMUSCULAR | Status: DC | PRN
  Administered 2019-06-16: 2 mg via INTRAVENOUS

## 2019-06-16 MED ORDER — FENTANYL CITRATE (PF) 100 MCG/2ML IJ SOLN
INTRAMUSCULAR | Status: AC
  Administered 2019-06-16: 25 ug via INTRAVENOUS
  Filled 2019-06-16: qty 2

## 2019-06-16 MED ORDER — DEXAMETHASONE SODIUM PHOSPHATE 10 MG/ML IJ SOLN
INTRAMUSCULAR | Status: DC | PRN
  Administered 2019-06-16: 10 mg via INTRAVENOUS

## 2019-06-16 SURGICAL SUPPLY — 33 items
BLADE SURG 15 STRL LF DISP TIS (BLADE) ×1 IMPLANT
BLADE SURG 15 STRL SS (BLADE) ×2
CANISTER SUCT 1200ML W/VALVE (MISCELLANEOUS) ×2 IMPLANT
CHLORAPREP W/TINT 26 (MISCELLANEOUS) ×2 IMPLANT
COVER WAND RF STERILE (DRAPES) ×2 IMPLANT
DERMABOND ADVANCED (GAUZE/BANDAGES/DRESSINGS) ×1
DERMABOND ADVANCED .7 DNX12 (GAUZE/BANDAGES/DRESSINGS) ×1 IMPLANT
DRAIN PENROSE 5/8X18 LTX STRL (WOUND CARE) ×1 IMPLANT
DRAPE LAPAROTOMY 77X122 PED (DRAPES) ×2 IMPLANT
ELECT CAUTERY BLADE TIP 2.5 (TIP) ×2
ELECT REM PT RETURN 9FT ADLT (ELECTROSURGICAL) ×2
ELECTRODE CAUTERY BLDE TIP 2.5 (TIP) ×1 IMPLANT
ELECTRODE REM PT RTRN 9FT ADLT (ELECTROSURGICAL) ×1 IMPLANT
GLOVE BIO SURGEON STRL SZ 6.5 (GLOVE) ×2 IMPLANT
GLOVE INDICATOR 7.0 STRL GRN (GLOVE) ×4 IMPLANT
GOWN STRL REUS W/ TWL LRG LVL3 (GOWN DISPOSABLE) ×2 IMPLANT
GOWN STRL REUS W/TWL LRG LVL3 (GOWN DISPOSABLE) ×4
KIT TURNOVER KIT A (KITS) ×2 IMPLANT
LABEL OR SOLS (LABEL) ×1 IMPLANT
MESH VENTRALEX ST 1-7/10 CRC S (Mesh General) ×1 IMPLANT
NEEDLE HYPO 22GX1.5 SAFETY (NEEDLE) ×2 IMPLANT
NS IRRIG 500ML POUR BTL (IV SOLUTION) ×2 IMPLANT
PACK BASIN MINOR (MISCELLANEOUS) ×2 IMPLANT
STRIP CLOSURE SKIN 1/2X4 (GAUZE/BANDAGES/DRESSINGS) ×2 IMPLANT
SUT ETHIBOND NAB MO 7 #0 18IN (SUTURE) ×1 IMPLANT
SUT MNCRL 4-0 (SUTURE) ×2
SUT MNCRL 4-0 27XMFL (SUTURE) ×1
SUT VIC AB 3-0 SH 27 (SUTURE) ×2
SUT VIC AB 3-0 SH 27X BRD (SUTURE) ×1 IMPLANT
SUT VICRYL 2-0 SH 8X27 (SUTURE) ×2 IMPLANT
SUTURE MNCRL 4-0 27XMF (SUTURE) ×1 IMPLANT
SYR 10ML LL (SYRINGE) ×2 IMPLANT
SYR BULB IRRIG 60ML STRL (SYRINGE) ×2 IMPLANT

## 2019-06-16 NOTE — Anesthesia Procedure Notes (Signed)
Procedure Name: Intubation Date/Time: 06/16/2019 10:07 AM Performed by: Doreen Salvage, CRNA Pre-anesthesia Checklist: Patient identified, Patient being monitored, Timeout performed, Emergency Drugs available and Suction available Patient Re-evaluated:Patient Re-evaluated prior to induction Oxygen Delivery Method: Circle system utilized Preoxygenation: Pre-oxygenation with 100% oxygen Induction Type: IV induction Ventilation: Mask ventilation without difficulty Laryngoscope Size: Mac, McGraph and 4 Grade View: Grade I Tube type: Oral Tube size: 7.0 mm Number of attempts: 1 Airway Equipment and Method: Stylet Placement Confirmation: ETT inserted through vocal cords under direct vision,  positive ETCO2 and breath sounds checked- equal and bilateral Secured at: 21 cm Tube secured with: Tape Dental Injury: Teeth and Oropharynx as per pre-operative assessment

## 2019-06-16 NOTE — Discharge Instructions (Signed)
Open Hernia Repair, Adult, Care After This sheet gives you information about how to care for yourself after your procedure. Your health care provider may also give you more specific instructions. If you have problems or questions, contact your health care provider. What can I expect after the procedure? After the procedure, it is common to have:  Mild discomfort.  Slight bruising.  Minor swelling.  Pain in the abdomen. Follow these instructions at home: Incision care   Follow instructions from your health care provider about how to take care of your incision area. Make sure you: ? Wash your hands with soap and water before you change your bandage (dressing). If soap and water are not available, use hand sanitizer. ? Change your dressing as told by your health care provider. ? Leave stitches (sutures), skin glue, or adhesive strips in place. These skin closures may need to stay in place for 2 weeks or longer. If adhesive strip edges start to loosen and curl up, you may trim the loose edges. Do not remove adhesive strips completely unless your health care provider tells you to do that.  Check your incision area every day for signs of infection. Check for: ? More redness, swelling, or pain. ? More fluid or blood. ? Warmth. ? Pus or a bad smell. Activity  Do not drive or use heavy machinery while taking prescription pain medicine. Do not drive until your health care provider approves.  Until your health care provider approves: ? Do not lift anything that is heavier than 10 lb (4.5 kg). ? Do not play contact sports.  Return to your normal activities as told by your health care provider. Ask your health care provider what activities are safe. General instructions  To prevent or treat constipation while you are taking prescription pain medicine, your health care provider may recommend that you: ? Drink enough fluid to keep your urine clear or pale yellow. ? Take over-the-counter or  prescription medicines. ? Eat foods that are high in fiber, such as fresh fruits and vegetables, whole grains, and beans. ? Limit foods that are high in fat and processed sugars, such as fried and sweet foods.  Take over-the-counter and prescription medicines only as told by your health care provider.  Do not take tub baths or go swimming until your health care provider approves.  Keep all follow-up visits as told by your health care provider. This is important. Contact a health care provider if:  You develop a rash.  You have more redness, swelling, or pain around your incision.  You have more fluid or blood coming from your incision.  Your incision feels warm to the touch.  You have pus or a bad smell coming from your incision.  You have a fever or chills.  You have blood in your stool (feces).  You have not had a bowel movement in 2-3 days.  Your pain is not controlled with medicine. Get help right away if:  You have chest pain or shortness of breath.  You feel light-headed or feel faint.  You have severe pain.  You vomit and your pain is worse. This information is not intended to replace advice given to you by your health care provider. Make sure you discuss any questions you have with your health care provider. Document Revised: 01/26/2017 Document Reviewed: 07/28/2015 Elsevier Patient Education  2020 [place]   1) The drugs that you were given will stay in your system until  tomorrow so for the next 24 hours you should not:  A) Drive an automobile B) Make any legal decisions C) Drink any alcoholic beverage   2) You may resume regular meals tomorrow.  Today it is better to start with liquids and gradually work up to solid foods.  You may eat anything you prefer, but it is better to start with liquids, then soup and crackers, and gradually work up to solid foods.   3) Please notify your doctor immediately  if you have any unusual bleeding, trouble breathing, redness and pain at the surgery site, drainage, fever, or pain not relieved by medication.    4) Additional Instructions:        Please contact your physician with any problems or Same Day Surgery at [phone], Monday through Friday 6 am to 4 pm, or [place] at [facility] number at [phone].

## 2019-06-16 NOTE — Transfer of Care (Signed)
Immediate Anesthesia Transfer of Care Note  Patient: Johnny Crawford  Procedure(s) Performed: Procedure(s): HERNIA REPAIR EPIGASTRIC ADULT (N/A)  Patient Location: PACU  Anesthesia Type:General  Level of Consciousness: sedated  Airway & Oxygen Therapy: Patient Spontanous Breathing and Patient connected to face mask oxygen  Post-op Assessment: Report given to RN and Post -op Vital signs reviewed and stable  Post vital signs: Reviewed and stable  Last Vitals:  Vitals:   06/16/19 0832  BP: 122/87  Pulse: 80  Resp: 18  Temp: (!) 36.1 C  SpO2: 97%    Complications: No apparent anesthesia complications

## 2019-06-16 NOTE — Op Note (Signed)
Operative Note  Pre-operative Diagnosis: Epigastric hernia  Post-operative Diagnosis: same, incarcerated  Operation: umbilical hernia repair with mesh  Surgeon: Duanne Guess, MD  Anesthesia: GETA  Assistant: Robynn Pane, RNFA  Findings: There was a small, approximately 1 cm defect with a significant amount of preperitoneal fat incarcerated within it.  I was unable to reduce it until I completely dissected the hernia sac free.  Estimated Blood Loss: Less than 5 cc         Drains: None         Specimens: None          Complications: None immediately apparent         Condition: stable  Procedure Details  The patient was identified in the preoperative holding area. The benefits, complications, treatment options, and expected outcomes were discussed with the patient. The risks of bleeding, infection, recurrence of symptoms, failure to resolve symptoms, bowel injury, any of which could require further surgery were reviewed with the patient. The patient agreed to accept these risks. The patient was then taken to the operating room, identified as Johnny Crawford and the procedure verified.  A time out was performed and the above information confirmed.  Prior to the induction of general anesthesia, antibiotic prophylaxis was administered. VTE prophylaxis was in place. General endotracheal anesthesia was then administered and tolerated well. After induction, the abdomen was prepped with Chloraprep and draped in standard sterile fashion. The patient was positioned in the supine position.  The skin overlying the proposed incision was infiltrated with a one-to-one mixture of 0.25% bupivacaine and 1% lidocaine with epinephrine.  A vertical incision was created over the hernia sac and electrocautery was used to dissect through subcutaneous tissue. The hernia was dissected free from adjacent tissue and fascia. The hernia sac was entered and the sac was excised. Care was taken to avoid any  injury to the bowel.  A 4.3 cm Ventralex mesh was introduced and attached to the fascia using interrupted 0 Ethibond sutures in standard fashion.  Liposomal bupivacaine was infiltrated along the fascial planes.  The cutaneous tissue was closed with 3-0 Vicryl and skin was closed with a subcuticular 4-0 Monocryl in a subcuticular fashion. Dermabond was applied to the skin, followed by Steri-Strips.  The patient was awakened, extubated, and taken to the postanesthesia care unit in good condition.  Patient tolerated procedure well and there were no immediate complications identified. Needle, instrument, and sponge counts were reported to be correct by the nursing staff.  Duanne Guess, MD FACS

## 2019-06-16 NOTE — Interval H&P Note (Signed)
History and Physical Interval Note:  06/16/2019 9:57 AM  Rush Landmark  has presented today for surgery, with the diagnosis of Epigastric hernia.  The various methods of treatment have been discussed with the patient and family. After consideration of risks, benefits and other options for treatment, the patient has consented to  Procedure(s): HERNIA REPAIR EPIGASTRIC ADULT (N/A) as a surgical intervention.  The patient's history has been reviewed, patient examined, no change in status, stable for surgery.  I have reviewed the patient's chart and labs.  Questions were answered to the patient's satisfaction.     Johnny Crawford

## 2019-06-20 NOTE — Anesthesia Postprocedure Evaluation (Signed)
Anesthesia Post Note  Patient: Johnny Crawford  Procedure(s) Performed: HERNIA REPAIR EPIGASTRIC ADULT (N/A )  Patient location during evaluation: PACU Anesthesia Type: General Level of consciousness: awake and alert Pain management: pain level controlled Vital Signs Assessment: post-procedure vital signs reviewed and stable Respiratory status: spontaneous breathing, nonlabored ventilation, respiratory function stable and patient connected to nasal cannula oxygen Cardiovascular status: blood pressure returned to baseline and stable Postop Assessment: no apparent nausea or vomiting Anesthetic complications: no     Last Vitals:  Vitals:   06/16/19 1151 06/16/19 1208  BP: 121/69 118/73  Pulse: 74 68  Resp: 20 18  Temp: 36.4 C (!) 36.2 C  SpO2: 100% 100%    Last Pain:  Vitals:   06/17/19 1013  TempSrc:   PainSc: 0-No pain                 Yevette Edwards

## 2019-06-20 NOTE — Anesthesia Preprocedure Evaluation (Signed)
Anesthesia Evaluation  Patient identified by MRN, date of birth, ID band Patient awake    Reviewed: Allergy & Precautions, H&P , NPO status , Patient's Chart, lab work & pertinent test results, reviewed documented beta blocker date and time   Airway Mallampati: II   Neck ROM: full    Dental  (+) Poor Dentition   Pulmonary neg pulmonary ROS, Current Smoker,    Pulmonary exam normal        Cardiovascular Exercise Tolerance: Good negative cardio ROS Normal cardiovascular exam Rhythm:regular Rate:Normal     Neuro/Psych negative neurological ROS  negative psych ROS   GI/Hepatic Neg liver ROS, GERD  Medicated,  Endo/Other  negative endocrine ROS  Renal/GU negative Renal ROS  negative genitourinary   Musculoskeletal   Abdominal   Peds  Hematology negative hematology ROS (+)   Anesthesia Other Findings Past Medical History: No date: GERD (gastroesophageal reflux disease) Past Surgical History: 06/16/2019: EPIGASTRIC HERNIA REPAIR; N/A     Comment:  Procedure: HERNIA REPAIR EPIGASTRIC ADULT;  Surgeon:               Duanne Guess, MD;  Location: [facility] ORS;  Service:               General;  Laterality: N/A; No date: NO PAST SURGERIES BMI    Body Mass Index: 27.26 kg/m     Reproductive/Obstetrics negative OB ROS                             Anesthesia Physical Anesthesia Plan  ASA: II  Anesthesia Plan: General   Post-op Pain Management:    Induction:   PONV Risk Score and Plan:   Airway Management Planned:   Additional Equipment:   Intra-op Plan:   Post-operative Plan:   Informed Consent: I have reviewed the patients History and Physical, chart, labs and discussed the procedure including the risks, benefits and alternatives for the proposed anesthesia with the patient or authorized representative who has indicated his/her understanding and acceptance.     Dental Advisory  Given  Plan Discussed with: CRNA  Anesthesia Plan Comments:         Anesthesia Quick Evaluation

## 2019-07-08 ENCOUNTER — Telehealth (INDEPENDENT_AMBULATORY_CARE_PROVIDER_SITE_OTHER): Payer: Self-pay | Admitting: General Surgery

## 2019-07-08 ENCOUNTER — Other Ambulatory Visit: Payer: Self-pay

## 2019-07-08 DIAGNOSIS — Z09 Encounter for follow-up examination after completed treatment for conditions other than malignant neoplasm: Secondary | ICD-10-CM

## 2019-07-08 NOTE — Progress Notes (Signed)
Virtual Visit via Telephone Note  I connected with Johnny Crawford on 07/08/19 at  9:00 AM EDT by telephone and verified that I am speaking with the correct person using two identifiers.   I discussed the limitations, risks, security and privacy concerns of performing an evaluation and management service by telephone and the availability of in person appointments. I also discussed with the patient that there may be a patient responsible charge related to this service. The patient expressed understanding and agreed to proceed.   History of Present Illness: This is a postoperative virtual visit for Johnny Crawford.  He underwent open repair of a small epigastric hernia on June 16, 2019.   Observations/Objective: He states that he feels like he has been doing very well after the surgery.  The pain that brought him to surgical attention has resolved.  He denies any significant postoperative pain.  He denies any fevers or chills.  No nausea or vomiting.  He does feel like there is a small lump at the surgical site, but denies any redness, warmth, or drainage.  He feels like the skin looks completely healed.  Assessment and Plan: This is a 39 year old man who had an epigastric hernia, repaired with a small Ventralex mesh.  He is doing well from his operation.  He may resume his usual activities, including weightlifting and other vigorous exercise as of May 31st 2021.  I told him that he could begin light exercise and cardiovascular exercise now.  I will plan to see him on an as-needed basis.  Follow Up Instructions:    I discussed the assessment and treatment plan with the patient. The patient was provided an opportunity to ask questions and all were answered. The patient agreed with the plan and demonstrated an understanding of the instructions.   The patient was advised to call back or seek an in-person evaluation if the symptoms worsen or if the condition fails to improve as anticipated.  I provided  9 minutes of non-face-to-face time during this encounter.   Duanne Guess, MD

## 2019-08-14 ENCOUNTER — Ambulatory Visit
Admission: EM | Admit: 2019-08-14 | Discharge: 2019-08-14 | Disposition: A | Discharge status: Home / Self Care | Payer: Self-pay | Attending: Physician Assistant | Admitting: Physician Assistant

## 2019-08-14 ENCOUNTER — Encounter: Payer: Self-pay | Admitting: Emergency Medicine

## 2019-08-14 ENCOUNTER — Other Ambulatory Visit: Payer: Self-pay

## 2019-08-14 DIAGNOSIS — K0889 Other specified disorders of teeth and supporting structures: Secondary | ICD-10-CM

## 2019-08-14 MED ORDER — AMOXICILLIN-POT CLAVULANATE 875-125 MG PO TABS: 1.0000 | ORAL_TABLET | Freq: Two times a day (BID) | ORAL | 0 refills | Status: DC

## 2019-08-14 MED ORDER — CHLORHEXIDINE GLUCONATE 0.12 % MT SOLN: 10.0000 mL | Freq: Two times a day (BID) | OROMUCOSAL | 0 refills | Status: DC

## 2019-08-14 NOTE — Discharge Instructions (Signed)
Start Augmentin as directed for dental infection. Peridex as directed. Ibuprofen 800mg  three times a day, tylenol 1000mg  three times a day. Follow up with dentist for further treatment and evaluation. If experiencing swelling of the throat, trouble breathing, trouble swallowing, leaning forward to breath, drooling, go to the emergency department for further evaluation.

## 2019-08-14 NOTE — ED Triage Notes (Signed)
This episode of dental pain started 3 days ago.  Pain is on bottom right.  Patient admits to many teeth need to be pulled.  Denies fever.  There is visible swelling to right jaw

## 2019-08-14 NOTE — ED Provider Notes (Signed)
[facility]    CSN: [id number] Arrival date & time: 08/14/19  0909      History   Chief Complaint Chief Complaint  Patient presents with   Dental Pain    HPI Johnny Crawford is a 39 y.o. male.   39 year old male comes in for 3 day history of right lower dental pain. States has multiple known cracked tooth, and has extensive dental procedures planned next month. No obvious new injury to the teeth. Started having pain and swelling. Denies fever, swelling of the throat.      Past Medical History:  Diagnosis Date   GERD (gastroesophageal reflux disease)     Patient Active Problem List   Diagnosis Date Noted   Incarcerated epigastric hernia 05/29/2019    Past Surgical History:  Procedure Laterality Date   EPIGASTRIC HERNIA REPAIR N/A 06/16/2019   Procedure: HERNIA REPAIR EPIGASTRIC ADULT;  Surgeon: Duanne Guess, MD;  Location: [facility] ORS;  Service: General;  Laterality: N/A;   HERNIA REPAIR     NO PAST SURGERIES         Home Medications    Prior to Admission medications   Medication Sig Start Date End Date Taking? Authorizing Provider  ibuprofen (ADVIL) 800 MG tablet Take 1 tablet (800 mg total) by mouth every 8 (eight) hours as needed. 06/16/19  Yes Duanne Guess, MD  amoxicillin-clavulanate (AUGMENTIN) 875-125 MG tablet Take 1 tablet by mouth every 12 (twelve) hours. 08/14/19   Cathie Hoops, Aybree Lanyon V, PA-C  chlorhexidine (PERIDEX) 0.12 % solution Use as directed 10 mLs in the mouth or throat 2 (two) times daily. 08/14/19   Belinda Fisher, PA-C    Family History Family History  Problem Relation Age of Onset   COPD Mother    Stroke Sister     Social History Social History   Tobacco Use   Smoking status: Current Every Day Smoker    Packs/day: 1.00    Types: Cigarettes   Smokeless tobacco: Never Used  Vaping Use   Vaping Use: Never used  Substance Use Topics   Alcohol use: Yes   Drug use: Yes    Types: Marijuana     Allergies   Patient  has no known allergies.   Review of Systems Review of Systems  Reason unable to perform ROS: See HPI as above.     Physical Exam Triage Vital Signs ED Triage Vitals  Enc Vitals Group     BP 08/14/19 0923 (!) 136/94     Pulse Rate 08/14/19 0923 74     Resp 08/14/19 0923 18     Temp 08/14/19 0923 97.9 F (36.6 C)     Temp Source 08/14/19 0923 Oral     SpO2 08/14/19 0923 95 %     Weight --      Height --      Head Circumference --      Peak Flow --      Pain Score 08/14/19 0921 7     Pain Loc --      Pain Edu? --      Excl. in GC? --    No data found.  Updated Vital Signs BP (!) 136/94 (BP Location: Right Arm)    Pulse 74    Temp 97.9 F (36.6 C) (Oral)    Resp 18    SpO2 95%   Physical Exam Constitutional:      General: He is not in acute distress.    Appearance: He is  well-developed. He is not ill-appearing, toxic-appearing or diaphoretic.  HENT:     Head: Normocephalic and atraumatic.     Jaw: No trismus.     Mouth/Throat:     Mouth: Mucous membranes are moist.     Pharynx: Oropharynx is clear. Uvula midline. No uvula swelling.     Tonsils: No tonsillar exudate.     Comments: Overall poor dentition. Right lower molars cracked to the gum with gum swelling and erythema. No fluctuance felt.  Floor of mouth soft to palpation. Mild facial swelling without obvious dental abscess. Musculoskeletal:     Cervical back: Normal range of motion and neck supple.  Skin:    General: Skin is warm and dry.  Neurological:     Mental Status: He is alert and oriented to person, place, and time.      UC Treatments / Results  Labs (all labs ordered are listed, but only abnormal results are displayed) Labs Reviewed - No data to display  EKG   Radiology No results found.  Procedures Procedures (including critical care time)  Medications Ordered in UC Medications - No data to display  Initial Impression / Assessment and Plan / UC Course  I have reviewed the triage  vital signs and the nursing notes.  Pertinent labs & imaging results that were available during my care of the patient were reviewed by me and considered in my medical decision making (see chart for details).    Start antibiotics for possible dental infection. Symptomatic treatment as needed. Discussed with patient symptoms can return if dental problem is not addressed. Follow up with dentist for further evaluation and treatment of dental pain. Resources given. Return precautions given.   Final Clinical Impressions(s) / UC Diagnoses   Final diagnoses:  Pain, dental   ED Prescriptions    Medication Sig Dispense Auth. Provider   amoxicillin-clavulanate (AUGMENTIN) 875-125 MG tablet Take 1 tablet by mouth every 12 (twelve) hours. 14 tablet Lizzete Gough V, PA-C   chlorhexidine (PERIDEX) 0.12 % solution Use as directed 10 mLs in the mouth or throat 2 (two) times daily. 120 mL Tasia Catchings, Vieno Tarrant V, PA-C     I have reviewed the PDMP during this encounter.   Ok Edwards, PA-C 08/14/19 [phone]

## 2019-12-11 ENCOUNTER — Encounter: Payer: Self-pay | Admitting: Emergency Medicine

## 2019-12-11 ENCOUNTER — Ambulatory Visit
Admission: EM | Admit: 2019-12-11 | Discharge: 2019-12-11 | Disposition: A | Discharge status: Home / Self Care | Payer: Self-pay | Attending: Family Medicine | Admitting: Family Medicine

## 2019-12-11 ENCOUNTER — Other Ambulatory Visit: Payer: Self-pay

## 2019-12-11 DIAGNOSIS — H5711 Ocular pain, right eye: Secondary | ICD-10-CM

## 2019-12-11 DIAGNOSIS — S0531XA Ocular laceration without prolapse or loss of intraocular tissue, right eye, initial encounter: Secondary | ICD-10-CM

## 2019-12-11 MED ORDER — TOBRAMYCIN-DEXAMETHASONE 0.3-0.1 % OP SUSP: 1.0000 [drp] | OPHTHALMIC | 0 refills | Status: AC

## 2019-12-11 NOTE — Discharge Instructions (Signed)
I have sent in Tobradex drops for you to use one drop in the right eye every 4 hours while awake  You may also get Lacri-lube over the counter and use this at night  Follow up with ophthalmology if symptoms are persisting  Follow up with the ER for changes in vision, worsening symptoms, trouble swallowing, trouble breathing, other concerning symptoms

## 2019-12-11 NOTE — ED Triage Notes (Signed)
Pt here for right eye pain and irritation with drainage x 2 days

## 2019-12-11 NOTE — ED Provider Notes (Signed)
[facility]   [id number] 12/11/19 Arrival Time: 1021  CC: EYE REDNESS  SUBJECTIVE:  Johnny Crawford is a 39 y.o. male who presents with complaint of eye redness that began 2 days ago.  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  Reports that he feels like he has something in his right eye.  Reports that this morning there was drainage and pus coming from the eye.  Reports that he did not pay attention to see what color it was.  Reports that the eye is watering.  Reports that symptoms are worsened by bright light.  Has not attempted OTC treatment for this.  Does not have established ophthalmology. Denies a precipitating event, trauma, or close contacts with similar symptoms. Has not tried OTC medications for this. Denies similar symptoms in the past. Denies fever, chills, nausea, vomiting, eye pain, painful eye movements, halos, discharge, itching, vision changes, double vision,  periorbital erythema.  Denies contact lens use.    ROS: As per HPI.  All other pertinent ROS negative.     Past Medical History:  Diagnosis Date  . GERD (gastroesophageal reflux disease)    Past Surgical History:  Procedure Laterality Date  . EPIGASTRIC HERNIA REPAIR N/A 06/16/2019   Procedure: HERNIA REPAIR EPIGASTRIC ADULT;  Surgeon: Duanne Guess, MD;  Location: [facility];  Service: General;  Laterality: N/A;  . HERNIA REPAIR    . NO PAST SURGERIES     No Known Allergies No current facility-administered medications on file prior to encounter.   Current Outpatient Medications on File Prior to Encounter  Medication Sig Dispense Refill  . amoxicillin-clavulanate (AUGMENTIN) 875-125 MG tablet Take 1 tablet by mouth every 12 (twelve) hours. (Patient not taking: Reported on 12/11/2019) 14 tablet 0  . chlorhexidine (PERIDEX) 0.12 % solution Use as directed 10 mLs in the mouth or throat 2 (two) times daily. 120 mL 0  . ibuprofen (ADVIL) 800 MG tablet Take 1 tablet (800 mg total) by  mouth every 8 (eight) hours as needed. 30 tablet 0   Social History   Socioeconomic History  . Marital status: Single    Spouse name: Not on file  . Number of children: Not on file  . Years of education: Not on file  . Highest education level: Not on file  Occupational History  . Occupation: BUILDS HOUSES!!  Tobacco Use  . Smoking status: Current Every Day Smoker    Packs/day: 1.00    Types: Cigarettes  . Smokeless tobacco: Never Used  Vaping Use  . Vaping Use: Never used  Substance and Sexual Activity  . Alcohol use: Yes  . Drug use: Yes    Types: Marijuana  . Sexual activity: Yes  Other Topics Concern  . Not on file  Social History Narrative   Patient lives alone. Struggles asking family for help but will coordinate with someone to      Stay overnight with him after surgery.   Social Determinants of Health   Financial Resource Strain:   . Difficulty of Paying Living Expenses: Not on file  Food Insecurity:   . Worried About Programme researcher, broadcasting/film/video in the Last Year: Not on file  . Ran Out of Food in the Last Year: Not on file  Transportation Needs:   . Lack of Transportation (Medical): Not on file  . Lack of Transportation (Non-Medical): Not on file  Physical Activity:   . Days of Exercise per Week: Not on file  . Minutes  of Exercise per Session: Not on file  Stress:   . Feeling of Stress : Not on file  Social Connections:   . Frequency of Communication with Friends and Family: Not on file  . Frequency of Social Gatherings with Friends and Family: Not on file  . Attends Religious Services: Not on file  . Active Member of Clubs or Organizations: Not on file  . Attends Banker Meetings: Not on file  . Marital Status: Not on file  Intimate Partner Violence:   . Fear of Current or Ex-Partner: Not on file  . Emotionally Abused: Not on file  . Physically Abused: Not on file  . Sexually Abused: Not on file   Family History  Problem Relation Age of Onset    . COPD Mother   . Stroke Sister     OBJECTIVE:     Vitals:   12/11/19 1043  BP: 138/88  Pulse: 79  Resp: 18  Temp: (!) 97.5 F (36.4 C)  TempSrc: Oral  SpO2: 98%    General appearance: alert; no distress Eyes: No conjunctival erythema. PERRL; EOMI without discomfort;  no obvious drainage; lid everted without obvious FB; fluorescein uptake to inner corner of R eye Neck: supple Lungs: clear to auscultation bilaterally Heart: regular rate and rhythm Skin: warm and dry Psychological: alert and cooperative; normal mood and affect   ASSESSMENT & PLAN:  1. Eye pain, right   2. Corneal laceration of right eye, initial encounter     Meds ordered this encounter  Medications  . tobramycin-dexamethasone (TOBRADEX) ophthalmic solution    Sig: Place 1 drop into the right eye every 4 (four) hours while awake for 5 days.    Dispense:  5 mL    Refill:  0    Order Specific Question:   Supervising Provider    Answer:   Merrilee Jansky [[id number]]    Corneal abrasion Use drops as prescribed and to completion May use lacri lube at night as well Use OTC systane or genteal gel eye drops at night as needed for symptomatic relief Use OTC ibuprofen or tylenol as needed for pain relief Return here or follow up with ophthamolgy if symptoms persists or worsen such as fever, chills, redness, swelling, eye pain, painful eye movements, vision changes.  Reviewed expectations re: course of current medical issues. Questions answered. Outlined signs and symptoms indicating need for more acute intervention. Patient verbalized understanding. After Visit Summary given.   [name], NP 12/11/19 1343

## 2020-11-24 ENCOUNTER — Encounter: Payer: Self-pay | Admitting: General Surgery

## 2021-02-10 IMAGING — CT CT ABD-PELV W/ CM
2 of 8 series · 15 of 46 positions shown, 17 images · IV contrast (APPLIED)
Comparison: No priors.

CLINICAL DATA: 38-year-old male with history of abdominal wall
hernia presenting with pain at the site of hernia.

EXAM:
CT ABDOMEN AND PELVIS WITH CONTRAST
TECHNIQUE: Multidetector CT imaging of the abdomen and pelvis was performed
using the standard protocol following bolus administration of
intravenous contrast.
CONTRAST:  100mL OMNIPAQUE IOHEXOL 300 MG/ML  SOLN

[Series 2: routine abd/pel with · axial · 0.76mm/px · z∈[-772,-352]mm · 12 of 100 slices shown, 14 images]
[im 8/100  soft-tissue]
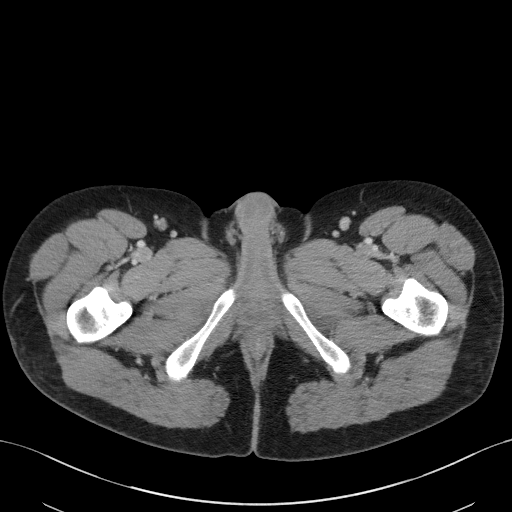
[im 8/100  bone]
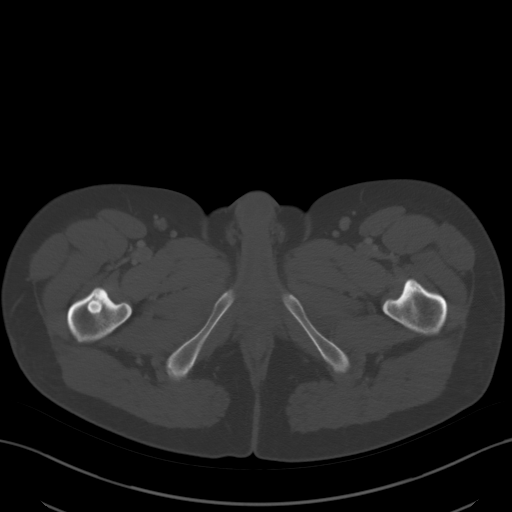
[im 15/100  soft-tissue]
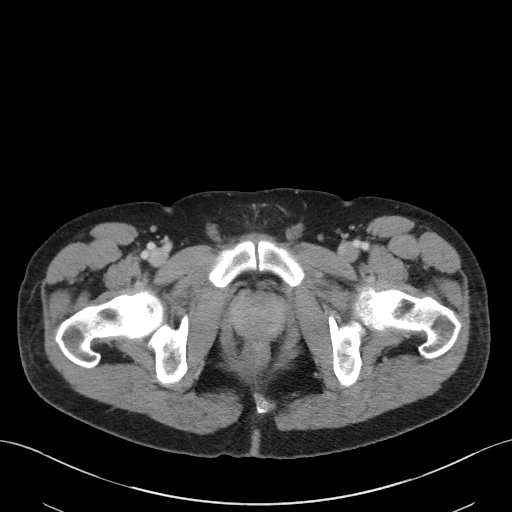
[im 22/100  soft-tissue]
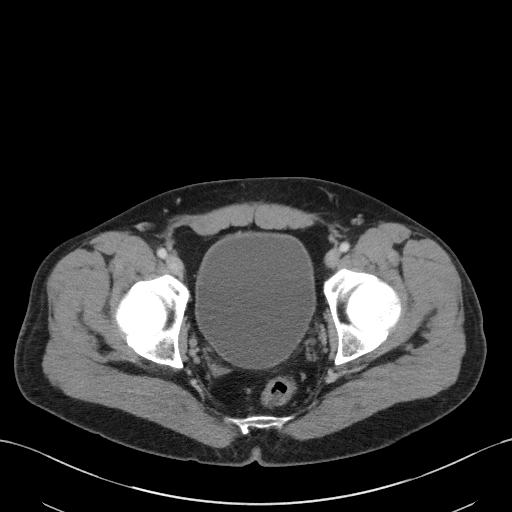
[im 29/100  soft-tissue]
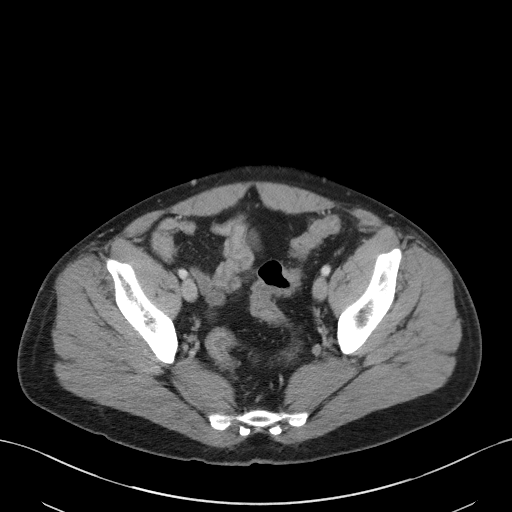
[im 36/100  soft-tissue]
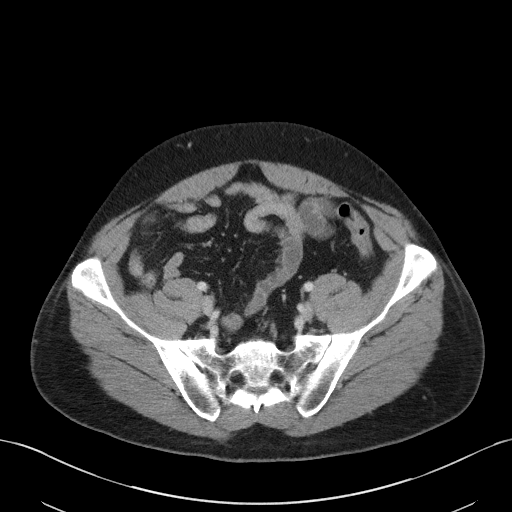
[im 43/100  soft-tissue]
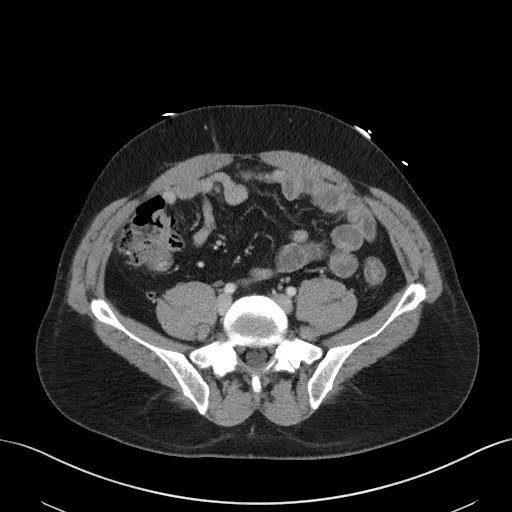
[im 57/100  soft-tissue]
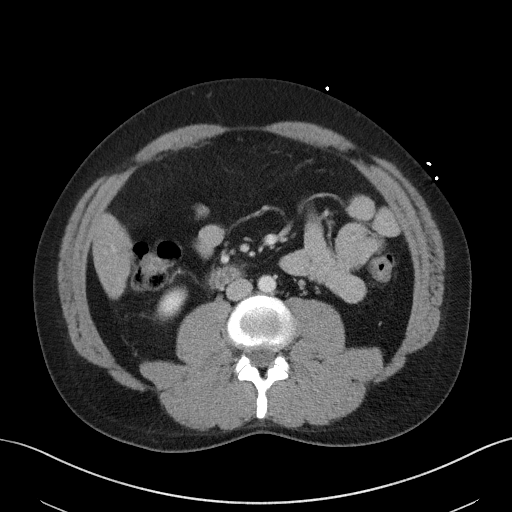
[im 64/100  soft-tissue]
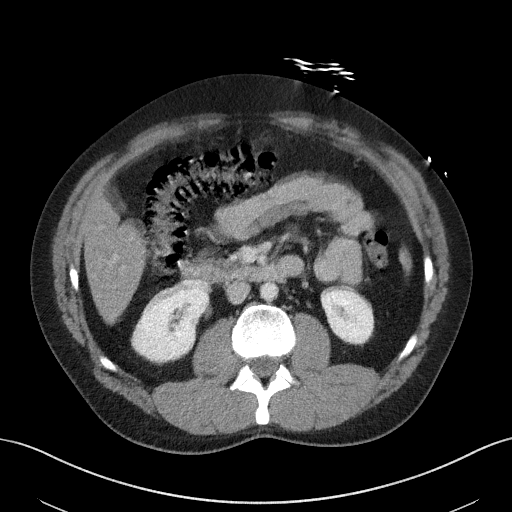
[im 71/100  soft-tissue]
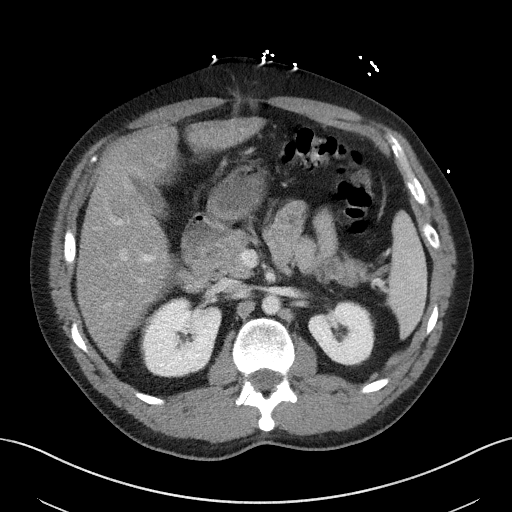
[im 71/100  bone]
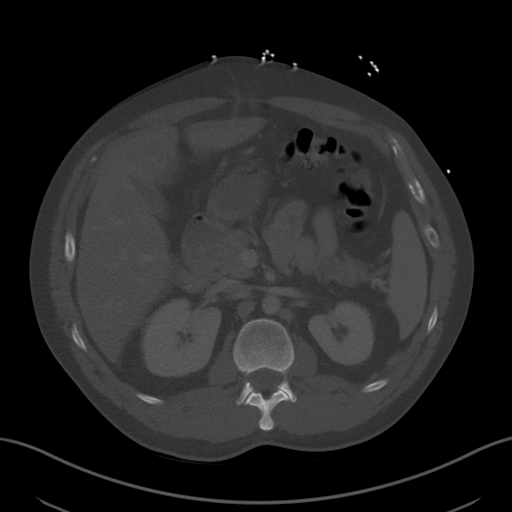
[im 78/100  soft-tissue]
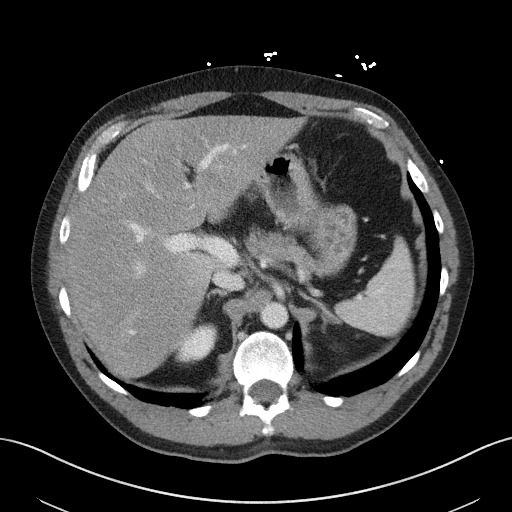
[im 85/100  soft-tissue]
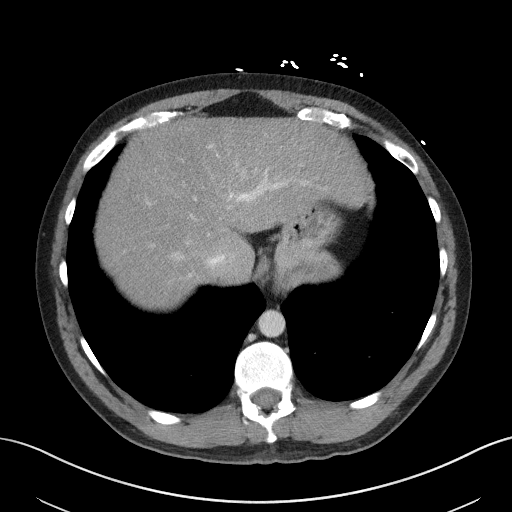
[im 92/100  soft-tissue]
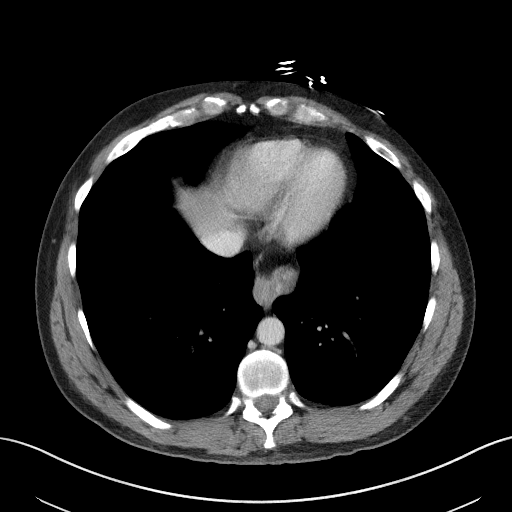

[Series 7: coronal st · coronal · 0.65mm/px · 3 of 100 slices shown]
[im 25/100  soft-tissue]
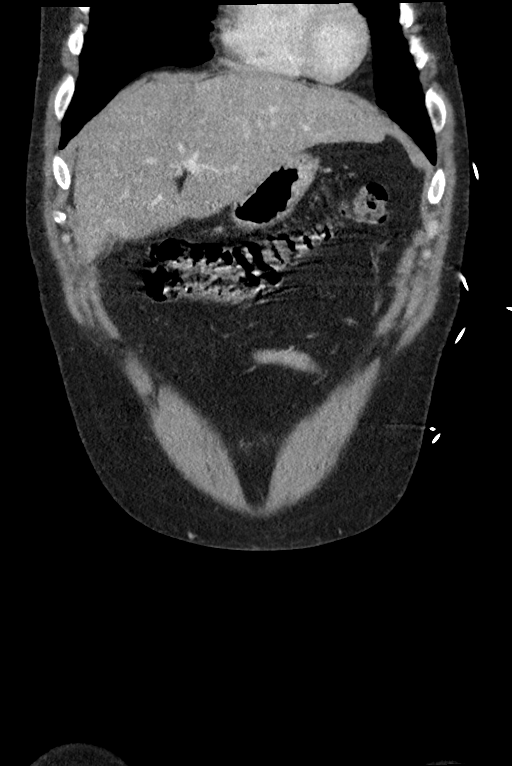
[im 50/100  soft-tissue]
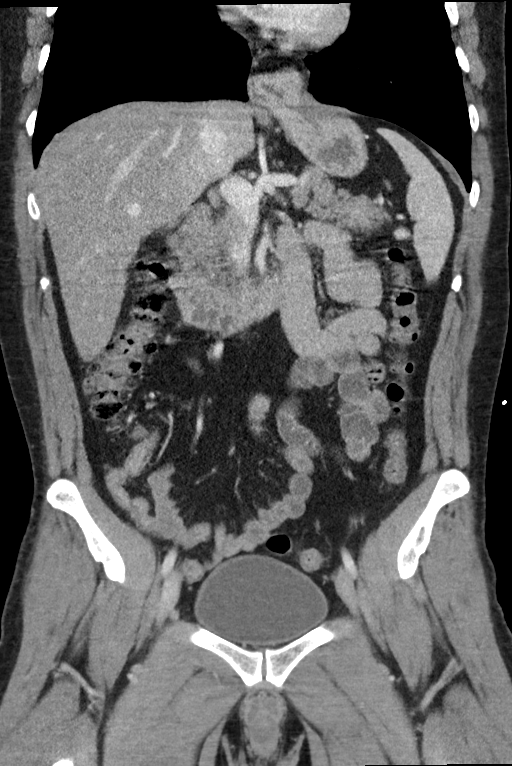
[im 75/100  soft-tissue]
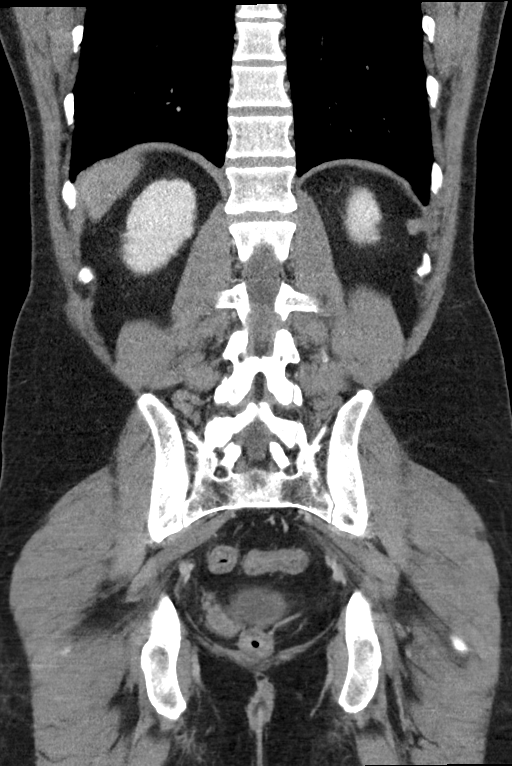

[15 of 46 positions shown; findings below may reference images not displayed]

FINDINGS: Lower chest: Moderate-sized hiatal hernia.

Hepatobiliary: Diffuse low attenuation throughout the hepatic
parenchyma, indicative of hepatic steatosis. No suspicious cystic or
solid hepatic lesions. No intra or extrahepatic biliary ductal
dilatation. Gallbladder is normal in appearance.

Pancreas: No pancreatic mass. No pancreatic ductal dilatation. No
pancreatic or peripancreatic fluid collections or inflammatory
changes.

Spleen: Unremarkable.

Adrenals/Urinary Tract: Bilateral kidneys and bilateral adrenal
glands are normal in appearance. No hydroureteronephrosis. Urinary
bladder is normal in appearance.

Stomach/Bowel: Normal appearance of the stomach. No pathologic
dilatation of small bowel or colon. Normal appendix.

Vascular/Lymphatic: No significant atherosclerotic disease, aneurysm
or dissection noted in the abdominal or pelvic vasculature. No
lymphadenopathy noted in the abdomen or pelvis.

Reproductive: Prostate gland and seminal vesicles are unremarkable
in appearance.

Other: Small epigastric ventral hernia containing only omental fat.
No significant volume of ascites. No pneumoperitoneum.

Musculoskeletal: There are no aggressive appearing lytic or blastic
lesions noted in the visualized portions of the skeleton.
IMPRESSION: 1. Small epigastric ventral hernia contains only omental fat. No
associated bowel incarceration or obstruction at this time.
2. Hepatic steatosis.
3. Moderate-sized hiatal hernia.

## 2022-01-09 ENCOUNTER — Ambulatory Visit (INDEPENDENT_AMBULATORY_CARE_PROVIDER_SITE_OTHER): Payer: Commercial Managed Care - HMO

## 2022-01-09 ENCOUNTER — Ambulatory Visit
Admission: EM | Admit: 2022-01-09 | Discharge: 2022-01-09 | Disposition: A | Discharge status: Home / Self Care | Payer: Commercial Managed Care - HMO | Attending: Physician Assistant | Admitting: Physician Assistant

## 2022-01-09 DIAGNOSIS — M549 Dorsalgia, unspecified: Secondary | ICD-10-CM

## 2022-01-09 DIAGNOSIS — M546 Pain in thoracic spine: Secondary | ICD-10-CM | POA: Diagnosis not present

## 2022-01-09 MED ORDER — CYCLOBENZAPRINE HCL 5 MG PO TABS: 10.0000 mg | ORAL_TABLET | Freq: Three times a day (TID) | ORAL | 0 refills | Status: AC | PRN

## 2022-01-09 NOTE — Discharge Instructions (Addendum)
No evidence of fracture on xray Can take flexeril as needed for muscle spasm Recommend ice to affected area Can take Ibuprofen as needed If no improvement return or follow up with primary care physician
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
## 2022-02-15 DIAGNOSIS — R079 Chest pain, unspecified: Secondary | ICD-10-CM | POA: Diagnosis not present

## 2023-05-09 ENCOUNTER — Encounter: Payer: Commercial Managed Care - HMO | Admitting: Family

## 2023-05-09 NOTE — Progress Notes (Signed)
 Erroneous encounter-disregard

## 2023-07-16 ENCOUNTER — Emergency Department (HOSPITAL_COMMUNITY): Admission: EM | Admit: 2023-07-16 | Discharge: 2023-07-17 | Disposition: A | Discharge status: Home / Self Care

## 2023-07-16 ENCOUNTER — Encounter (HOSPITAL_COMMUNITY): Payer: Self-pay

## 2023-07-16 ENCOUNTER — Other Ambulatory Visit: Payer: Self-pay

## 2023-07-16 DIAGNOSIS — R0602 Shortness of breath: Secondary | ICD-10-CM | POA: Diagnosis present

## 2023-07-16 DIAGNOSIS — R062 Wheezing: Secondary | ICD-10-CM | POA: Insufficient documentation

## 2023-07-16 DIAGNOSIS — F17200 Nicotine dependence, unspecified, uncomplicated: Secondary | ICD-10-CM | POA: Diagnosis not present

## 2023-07-16 DIAGNOSIS — J441 Chronic obstructive pulmonary disease with (acute) exacerbation: Secondary | ICD-10-CM

## 2023-07-16 LAB — BASIC METABOLIC PANEL WITH GFR
Anion gap: 10 (ref 5–15)
BUN: 12 mg/dL (ref 6–20)
CO2: 26 mmol/L (ref 22–32)
Calcium: 9.7 mg/dL (ref 8.9–10.3)
Chloride: 101 mmol/L (ref 98–111)
Creatinine, Ser: 0.83 mg/dL (ref 0.61–1.24)
GFR, Estimated: >60 mL/min (ref >60)
Glucose, Bld: 117 mg/dL — ABNORMAL HIGH (ref 70–99)
Potassium: 3.8 mmol/L (ref 3.5–5.1)
Sodium: 137 mmol/L (ref 135–145)

## 2023-07-16 LAB — CBC
HCT: 45.7 % (ref 39.0–52.0)
Hemoglobin: 15 g/dL (ref 13.0–17.0)
MCH: 30.9 pg (ref 26.0–34.0)
MCHC: 32.8 g/dL (ref 30.0–36.0)
MCV: 94 fL (ref 80.0–100.0)
Platelets: 248 10*3/uL (ref 150–400)
RBC: 4.86 MIL/uL (ref 4.22–5.81)
RDW: 12.5 % (ref 11.5–15.5)
WBC: 7.4 10*3/uL (ref 4.0–10.5)
nRBC: 0 % (ref 0.0–0.2)

## 2023-07-16 LAB — TROPONIN I (HIGH SENSITIVITY): Troponin I (High Sensitivity): 7 ng/L (ref <18)

## 2023-07-16 MED ORDER — ALBUTEROL SULFATE (2.5 MG/3ML) 0.083% IN NEBU
5.0000 mg | INHALATION_SOLUTION | Freq: Once | RESPIRATORY_TRACT | Status: AC
  Administered 2023-07-16: 5 mg via RESPIRATORY_TRACT
  Filled 2023-07-16: qty 6

## 2023-07-16 MED ORDER — IPRATROPIUM-ALBUTEROL 0.5-2.5 (3) MG/3ML IN SOLN
3.0000 mL | Freq: Once | RESPIRATORY_TRACT | Status: AC
  Administered 2023-07-16: 3 mL via RESPIRATORY_TRACT
  Filled 2023-07-16: qty 3

## 2023-07-16 MED ORDER — IPRATROPIUM BROMIDE 0.02 % IN SOLN
0.5000 mg | Freq: Once | RESPIRATORY_TRACT | Status: AC
  Administered 2023-07-16: 0.5 mg via RESPIRATORY_TRACT
  Filled 2023-07-16: qty 2.5

## 2023-07-16 MED ORDER — METHYLPREDNISOLONE SODIUM SUCC 125 MG IJ SOLR
125.0000 mg | Freq: Once | INTRAMUSCULAR | Status: AC
  Administered 2023-07-16: 125 mg via INTRAVENOUS
  Filled 2023-07-16: qty 2

## 2023-07-16 NOTE — ED Provider Notes (Signed)
 Care of patient received from prior provider at 3:21 AM, please see their note for complete H/P and care plan.  Received handoff per ED course.  Clinical Course as of 07/17/23 0321  Mon Jul 16, 2023  2358 Stable HO from TJY SOB S/P 2 breathing treatments and getting better.  [CC]  Tue Jul 17, 2023  0102 SpO2 87% on room air per nursing.  Send on 3 L nasal cannula for acute hypoxic respiratory failure. Added on IV magnesium therapy for treatment and will plan for admission to medicine for ongoing care and management. [CC]    Clinical Course User Index [CC] Onetha Bile, MD   CRITICAL CARE Performed by: Onetha Bile   Total critical care time: 45 minutes  Critical care time was exclusive of separately billable procedures and treating other patients.  Critical care was necessary to treat or prevent imminent or life-threatening deterioration.  Critical care was time spent personally by me on the following activities: development of treatment plan with patient and/or surrogate as well as nursing, discussions with consultants, evaluation of patient's response to treatment, examination of patient, obtaining history from patient or surrogate, ordering and performing treatments and interventions, ordering and review of laboratory studies, ordering and review of radiographic studies, pulse oximetry and re-evaluation of patient's condition.  Reassessment: I was called back to bedside as patient is requesting discharge.States he has to take care of his dog. Off O2 with sats 92% at rest after all therapies. Recommended patient stay for observation but patient ultimately declined.   Disposition:  Patient is requesting discharge at this time.  Given patient's understanding of risk of severe missed diagnosis based on limitations of today's evaluation and risk of interval worsening of disease including life or limb threatening pathology, will participate in shared medical decision making and  patient directed discharge at this time.  Patient is welcome to return for further diagnostic evaluation/therapeutic management at any time.      Onetha Bile, MD 07/17/23 [phone]
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
## 2023-07-17 ENCOUNTER — Emergency Department (HOSPITAL_COMMUNITY)

## 2023-07-17 DIAGNOSIS — R062 Wheezing: Secondary | ICD-10-CM | POA: Diagnosis not present

## 2023-07-17 MED ORDER — IPRATROPIUM-ALBUTEROL 0.5-2.5 (3) MG/3ML IN SOLN
3.0000 mL | Freq: Once | RESPIRATORY_TRACT | Status: AC
  Administered 2023-07-17: 3 mL via RESPIRATORY_TRACT
  Filled 2023-07-17: qty 3

## 2023-07-17 MED ORDER — ALBUTEROL SULFATE HFA 108 (90 BASE) MCG/ACT IN AERS
2.0000 | INHALATION_SPRAY | RESPIRATORY_TRACT | Status: DC
  Administered 2023-07-17: 2 via RESPIRATORY_TRACT
  Filled 2023-07-17: qty 6.7

## 2023-07-17 MED ORDER — PREDNISONE 50 MG PO TABS
50.0000 mg | ORAL_TABLET | Freq: Once | ORAL | Status: AC
  Administered 2023-07-17: 50 mg via ORAL
  Filled 2023-07-17: qty 1

## 2023-07-17 MED ORDER — IPRATROPIUM-ALBUTEROL 0.5-2.5 (3) MG/3ML IN SOLN: 3.0000 mL | RESPIRATORY_TRACT | Status: DC | PRN

## 2023-07-17 MED ORDER — PREDNISONE 50 MG PO TABS: 50.0000 mg | ORAL_TABLET | Freq: Every day | ORAL | 0 refills | Status: AC

## 2023-07-17 MED ORDER — MAGNESIUM SULFATE 2 GM/50ML IV SOLN
2.0000 g | Freq: Once | INTRAVENOUS | Status: AC
  Administered 2023-07-17: 2 g via INTRAVENOUS
  Filled 2023-07-17: qty 50

## 2023-07-17 NOTE — ED Provider Notes (Signed)
 [place] EMERGENCY DEPARTMENT AT [facility] Provider Note   CSN: [id number] Arrival date & time: 07/16/23  2209     History  Chief Complaint  Patient presents with   Shortness of Breath    Johnny Crawford is a 43 y.o. male.  43 year old male presents the emergency department for shortness of breath.  Reports that he had on and off again shortness of breath for months.  He said symptoms for the past couple days.  Cough, nonproductive.  He is a current smoker.  He has been using his mother's albuterol  with some improvement.  Does not regularly see a doctor.   Shortness of Breath      Home Medications Prior to Admission medications   Medication Sig Start Date End Date Taking? Authorizing Provider  amoxicillin -clavulanate (AUGMENTIN ) 875-125 MG tablet Take 1 tablet by mouth every 12 (twelve) hours. Patient not taking: Reported on 12/11/2019 08/14/19   Alfrieda Antes, PA-C  chlorhexidine  (PERIDEX ) 0.12 % solution Use as directed 10 mLs in the mouth or throat 2 (two) times daily. 08/14/19   Wilhelmenia Harada, Amy V, PA-C  ibuprofen  (ADVIL ) 800 MG tablet Take 1 tablet (800 mg total) by mouth every 8 (eight) hours as needed. 06/16/19   Mercy Stall, MD      Allergies    Patient has no known allergies.    Review of Systems   Review of Systems  Respiratory:  Positive for shortness of breath.     Physical Exam Updated Vital Signs Pulse 73   Temp 97.7 F (36.5 C)   Resp 19   Ht 5\' 10"  (1.778 m)   Wt 79.4 kg   SpO2 100%   BMI 25.11 kg/m  Physical Exam Vitals and nursing note reviewed.  Constitutional:      Appearance: He is not toxic-appearing.  Cardiovascular:     Rate and Rhythm: Normal rate and regular rhythm.  Pulmonary:     Effort: Tachypnea and accessory muscle usage present.     Breath sounds: Wheezing present.  Chest:     Chest wall: No tenderness.  Musculoskeletal:     Right lower leg: No edema.     Left lower leg: No edema.  Skin:    General: Skin is warm.      Capillary Refill: Capillary refill takes less than 2 seconds.  Neurological:     Mental Status: He is alert and oriented to person, place, and time.  Psychiatric:        Mood and Affect: Mood normal.        Behavior: Behavior normal.     ED Results / Procedures / Treatments   Labs (all labs ordered are listed, but only abnormal results are displayed) Labs Reviewed  BASIC METABOLIC PANEL WITH GFR - Abnormal; Notable for the following components:      Result Value   Glucose, Bld 117 (*)    All other components within normal limits  CBC  TROPONIN I (HIGH SENSITIVITY)  TROPONIN I (HIGH SENSITIVITY)    EKG None  Radiology DG Chest Portable 1 View Result Date: 07/17/2023 CLINICAL DATA:  Dyspnea EXAM: PORTABLE CHEST 1 VIEW COMPARISON:  None Available. FINDINGS: The lungs are symmetrically hyperinflated in keeping with changes of underlying COPD. The lungs are clear. No pneumothorax or pleural effusion. Cardiac size is within normal limits. Pulmonary vascularity is normal. Osseous structures are age appropriate. Multiple healed right rib fractures are noted. IMPRESSION: 1. No active disease. COPD. Electronically Signed   By:  Worthy Heads M.D.   On: 07/17/2023 00:17    Procedures Procedures    Medications Ordered in ED Medications  methylPREDNISolone  sodium succinate (SOLU-MEDROL ) 125 mg/2 mL injection 125 mg (125 mg Intravenous Given 07/16/23 2233)  albuterol  (PROVENTIL ) (2.5 MG/3ML) 0.083% nebulizer solution 5 mg (5 mg Nebulization Given 07/16/23 2232)  ipratropium (ATROVENT ) nebulizer solution 0.5 mg (0.5 mg Nebulization Given 07/16/23 2232)  ipratropium-albuterol  (DUONEB) 0.5-2.5 (3) MG/3ML nebulizer solution 3 mL (3 mLs Nebulization Given 07/16/23 2347)    ED Course/ Medical Decision Making/ A&P Clinical Course as of 07/17/23 0026  Mon Jul 16, 2023  2358 Stable HO from TJY SOB S/P 2 breathing treatments and getting better.  [CC]    Clinical Course User Index [CC]  Onetha Bile, MD                                 Medical Decision Making 43 year old male presents emergency department for shortness of breath.  Afebrile, mildly elevated heart rate on arrival, but did receive DuoNeb with EMS per the report.  Not hypoxic.  Mild to moderate respiratory distress.  Diffuse wheezing on exam.  I suspect undiagnosed COPD as he is a current smoker and he has seemingly had waxing and waning shortness of breath for the past several months and has had improvement with his mother albuterol  at home.  Given breathing treatment here as well as steroids.  Basic labs are reassuring.  Chest x-ray consistent with COPD.  Care signed out to overnight team.  Dispo pending reevaluation after second breathing treatment and ambulation.  Amount and/or Complexity of Data Reviewed Independent Historian: EMS    Details: Gave DuoNeb prior to arrival External Data Reviewed:     Details: No history of COPD. Labs: ordered. Decision-making details documented in ED Course. Radiology: ordered and independent interpretation performed.    Details: No pneumonia pneumothorax  Risk Prescription drug management. Decision regarding hospitalization. Diagnosis or treatment significantly limited by social determinants of health. Risk Details: Poor health literacy         Final Clinical Impression(s) / ED Diagnoses Final diagnoses:  None    Rx / DC Orders ED Discharge Orders     None         Rolinda Climes, DO 07/17/23 0026

## 2023-07-17 NOTE — ED Notes (Signed)
 Patient sats 88-89% on room air. Patient placed back on 3L [place] at this time due to provider putting him on room air. provider aware.

## 2023-11-02 ENCOUNTER — Other Ambulatory Visit: Payer: Self-pay | Admitting: Family Medicine

## 2023-11-02 DIAGNOSIS — R911 Solitary pulmonary nodule: Secondary | ICD-10-CM

## 2023-11-21 ENCOUNTER — Encounter: Payer: Self-pay | Admitting: Family Medicine

## 2023-11-22 ENCOUNTER — Other Ambulatory Visit

## 2023-11-26 ENCOUNTER — Inpatient Hospital Stay: Admission: RE | Admit: 2023-11-26 | Source: Ambulatory Visit

## 2023-12-04 ENCOUNTER — Other Ambulatory Visit

## 2024-02-06 ENCOUNTER — Ambulatory Visit (INDEPENDENT_AMBULATORY_CARE_PROVIDER_SITE_OTHER)

## 2024-02-06 VITALS — BP 112/75 | HR 77 | Temp 97.3°F | Resp 18 | Ht 69.0 in | Wt 162.2 lb

## 2024-02-06 DIAGNOSIS — J449 Chronic obstructive pulmonary disease, unspecified: Secondary | ICD-10-CM

## 2024-02-06 DIAGNOSIS — Z716 Tobacco abuse counseling: Secondary | ICD-10-CM | POA: Diagnosis not present

## 2024-02-06 MED ORDER — TIOTROPIUM BROMIDE-OLODATEROL 2.5-2.5 MCG/ACT IN AERS: 2.0000 | INHALATION_SPRAY | Freq: Every day | RESPIRATORY_TRACT | 5 refills | Status: AC

## 2024-02-06 MED ORDER — ALBUTEROL SULFATE HFA 108 (90 BASE) MCG/ACT IN AERS: 2.0000 | INHALATION_SPRAY | Freq: Four times a day (QID) | RESPIRATORY_TRACT | 3 refills | Status: AC | PRN

## 2024-02-06 NOTE — Progress Notes (Signed)
° ° ° °  Patient ID: IVEY CINA, male    DOB: 06-Nov-1980  MRN: [id number]  CC: Establish Care (Inhaler requested)   Subjective: Johnny Crawford is a 43 y.o. male with past medical history of COPD who presents to clinic to establish care.  Patient reports that he has been dealing with shortness of breath symptoms for the past 2 years, but symptoms have been worse this past year.  Patient reports that he is experiencing shortness of breath with activities that require effort such as when picking up objects or while he is at work.  Symptoms are present every day.  Previously had a prescription for albuterol  inhaler and he used it multiple times during the day.  Since then has been using friends albuterol  inhalers for symptom relief. Smokes cigarettes but 3 months ago he cut down to less than 1 pack a day.  Previously was smoking 2 packs a day.  Reports actively working on trying to decrease the amount of cigarettes she smokes.  Last CPE: never  No Known Allergies  ROS: Review of Systems Negative except as stated above  PHYSICAL EXAM: BP 112/75 (BP Location: Left Arm)   Pulse 77   Temp (!) 97.3 F (36.3 C)   Resp 18   Ht 5' 9 (1.753 m)   Wt 162 lb 3.2 oz (73.6 kg)   SpO2 95%   BMI 23.95 kg/m   Physical Exam  General: well-appearing, no acute distress Skin: no jaundice, rashes, or lesions Cardiovascular: regular heart rate and rhythm, normal S1/S2, no murmurs, gallops, or rubs, peripheral pulses 2+ bilaterally Chest:  lungs clear to auscultation bilaterally, equal breath sounds bilaterally Musculoskeletal: normal gait Extremities: no peripheral edema  ASSESSMENT AND PLAN:  1. Chronic obstructive pulmonary disease, unspecified COPD type (HCC) (Primary) - Patient experiencing shortness of breath symptoms every day and has no current medication regimen.  Plan to start LABA/LAMA and rescue inhaler.  Reviewed pathophysiology of COPD with patient.  - Start Tiotropium  Bromide-Olodaterol 2.5-2.5 MCG/ACT AERS; Inhale 2 puffs into the lungs daily.  Dispense: 4 g; Refill: 5 - Start albuterol  (VENTOLIN  HFA) 108 (90 Base) MCG/ACT inhaler; Inhale 2 puffs into the lungs every 6 (six) hours as needed for wheezing or shortness of breath.  Dispense: 34 g; Refill: 3  2. Encounter for smoking cessation counseling - Discussed with patient dangers of continued smoking and effects on lung tissue.  - Patient expressed understanding and reports he is actively trying to cut down on cigarette smoking.  Declined nicotine replacement medications at this time   Patient was given the opportunity to ask questions.  Patient verbalized understanding of the plan and was able to repeat key elements of the plan.    No orders of the defined types were placed in this encounter.    Requested Prescriptions   Signed Prescriptions Disp Refills   Tiotropium Bromide-Olodaterol 2.5-2.5 MCG/ACT AERS 4 g 5    Sig: Inhale 2 puffs into the lungs daily.   albuterol  (VENTOLIN  HFA) 108 (90 Base) MCG/ACT inhaler 34 g 3    Sig: Inhale 2 puffs into the lungs every 6 (six) hours as needed for wheezing or shortness of breath.    Return in about 2 months (around 04/08/2024) for physical, labs.  Sula Leavy Rode, PA-C

## 2024-03-27 ENCOUNTER — Telehealth: Payer: Self-pay

## 2024-03-27 NOTE — Telephone Encounter (Signed)
 Pt requesting refill for both inhalers. Please advise

## 2024-03-27 NOTE — Telephone Encounter (Signed)
 Patient called and LVM to pick up inhalers

## 2024-03-27 NOTE — Telephone Encounter (Signed)
 Patient has refills available with last prescription. Needs to contact pharmacy.

## 2024-04-08 ENCOUNTER — Encounter
